# Patient Record
Sex: Male | Born: 1945 | Race: White | Hispanic: No | Marital: Married | Smoking: Never smoker
Health system: Southern US, Community
[De-identification: ages and names within clinical notes are randomized; demographics above are authoritative.]

## PROBLEM LIST (undated history)

## (undated) DIAGNOSIS — S42309A Unspecified fracture of shaft of humerus, unspecified arm, initial encounter for closed fracture: Secondary | ICD-10-CM

## (undated) DIAGNOSIS — E119 Type 2 diabetes mellitus without complications: Secondary | ICD-10-CM

## (undated) DIAGNOSIS — I10 Essential (primary) hypertension: Secondary | ICD-10-CM

## (undated) DIAGNOSIS — G4730 Sleep apnea, unspecified: Secondary | ICD-10-CM

## (undated) DIAGNOSIS — E785 Hyperlipidemia, unspecified: Secondary | ICD-10-CM

## (undated) DIAGNOSIS — C801 Malignant (primary) neoplasm, unspecified: Secondary | ICD-10-CM

## (undated) HISTORY — DX: Hyperlipidemia, unspecified: E78.5

## (undated) HISTORY — PX: VASECTOMY: SHX75

## (undated) HISTORY — DX: Essential (primary) hypertension: I10

## (undated) HISTORY — DX: Sleep apnea, unspecified: G47.30

## (undated) HISTORY — PX: TONSILLECTOMY: SUR1361

## (undated) HISTORY — DX: Type 2 diabetes mellitus without complications: E11.9

## (undated) HISTORY — DX: Malignant (primary) neoplasm, unspecified: C80.1

## (undated) HISTORY — PX: VASECTOMY REVERSAL: SHX243

## (undated) HISTORY — PX: EYE SURGERY: SHX253

## (undated) HISTORY — PX: POLYPECTOMY: SHX149

---

## 1998-03-31 HISTORY — PX: HAND SURGERY: SHX662

## 1998-07-28 ENCOUNTER — Emergency Department (HOSPITAL_COMMUNITY): Admission: EM | Admit: 1998-07-28 | Discharge: 1998-07-28 | Payer: Self-pay | Admitting: Emergency Medicine

## 2003-04-01 DIAGNOSIS — C801 Malignant (primary) neoplasm, unspecified: Secondary | ICD-10-CM

## 2003-04-01 HISTORY — PX: PROSTATECTOMY: SHX69

## 2003-04-01 HISTORY — DX: Malignant (primary) neoplasm, unspecified: C80.1

## 2003-05-17 ENCOUNTER — Inpatient Hospital Stay (HOSPITAL_COMMUNITY): Admission: RE | Admit: 2003-05-17 | Discharge: 2003-05-20 | Payer: Self-pay | Admitting: Urology

## 2003-05-17 ENCOUNTER — Encounter (INDEPENDENT_AMBULATORY_CARE_PROVIDER_SITE_OTHER): Payer: Self-pay | Admitting: *Deleted

## 2003-06-21 ENCOUNTER — Ambulatory Visit (HOSPITAL_BASED_OUTPATIENT_CLINIC_OR_DEPARTMENT_OTHER): Admission: RE | Admit: 2003-06-21 | Discharge: 2003-06-21 | Payer: Self-pay | Admitting: Urology

## 2005-03-31 HISTORY — PX: COLONOSCOPY: SHX174

## 2005-04-10 ENCOUNTER — Ambulatory Visit: Payer: Self-pay | Admitting: Gastroenterology

## 2005-05-02 ENCOUNTER — Ambulatory Visit: Payer: Self-pay | Admitting: Gastroenterology

## 2005-05-02 ENCOUNTER — Encounter (INDEPENDENT_AMBULATORY_CARE_PROVIDER_SITE_OTHER): Payer: Self-pay | Admitting: *Deleted

## 2007-12-05 ENCOUNTER — Emergency Department (HOSPITAL_BASED_OUTPATIENT_CLINIC_OR_DEPARTMENT_OTHER): Admission: EM | Admit: 2007-12-05 | Discharge: 2007-12-05 | Payer: Self-pay | Admitting: Emergency Medicine

## 2010-04-24 ENCOUNTER — Encounter (INDEPENDENT_AMBULATORY_CARE_PROVIDER_SITE_OTHER): Payer: Self-pay | Admitting: *Deleted

## 2010-05-02 ENCOUNTER — Encounter (INDEPENDENT_AMBULATORY_CARE_PROVIDER_SITE_OTHER): Payer: Self-pay | Admitting: *Deleted

## 2010-05-02 ENCOUNTER — Encounter: Payer: Self-pay | Admitting: Gastroenterology

## 2010-05-02 ENCOUNTER — Other Ambulatory Visit: Payer: BC Managed Care – PPO

## 2010-05-02 ENCOUNTER — Ambulatory Visit (INDEPENDENT_AMBULATORY_CARE_PROVIDER_SITE_OTHER): Payer: BC Managed Care – PPO | Admitting: Gastroenterology

## 2010-05-02 ENCOUNTER — Other Ambulatory Visit: Payer: Self-pay | Admitting: Gastroenterology

## 2010-05-02 DIAGNOSIS — K5730 Diverticulosis of large intestine without perforation or abscess without bleeding: Secondary | ICD-10-CM | POA: Insufficient documentation

## 2010-05-02 DIAGNOSIS — R197 Diarrhea, unspecified: Secondary | ICD-10-CM | POA: Insufficient documentation

## 2010-05-02 LAB — CBC WITH DIFFERENTIAL/PLATELET
Basophils Absolute: 0 10*3/uL (ref 0.0–0.1)
Basophils Relative: 0.3 % (ref 0.0–3.0)
Eosinophils Relative: 1.5 % (ref 0.0–5.0)
Hemoglobin: 15.1 g/dL (ref 13.0–17.0)
Lymphocytes Relative: 20.6 % (ref 12.0–46.0)
Monocytes Relative: 7.3 % (ref 3.0–12.0)
Neutro Abs: 9.4 10*3/uL — ABNORMAL HIGH (ref 1.4–7.7)
RBC: 5.01 Mil/uL (ref 4.22–5.81)

## 2010-05-02 LAB — HEPATIC FUNCTION PANEL
ALT: 26 U/L (ref 0–53)
Bilirubin, Direct: 0.1 mg/dL (ref 0.0–0.3)
Total Protein: 6.4 g/dL (ref 6.0–8.3)

## 2010-05-02 LAB — BASIC METABOLIC PANEL
BUN: 11 mg/dL (ref 6–23)
CO2: 25 mEq/L (ref 19–32)
Chloride: 106 mEq/L (ref 96–112)
Creatinine, Ser: 1.1 mg/dL (ref 0.4–1.5)

## 2010-05-02 NOTE — Letter (Signed)
Summary: Colonoscopy Date Change Letter  [facility]  [address] [place], [place] [address]   Phone: [phone]  Fax: [phone]      April 24, 2010 MRN: [id number]   Jimmy Oconnor [address] [place], [place]  [address]   Dear Mr. Leasure,   Previously you were recommended to have a repeat colonoscopy around this time. Your chart was recently reviewed by Dr. Russella Dar of [facility]. Follow up colonoscopy is now recommended in February 2017. This revised recommendation is based on current, nationally recognized guidelines for colorectal cancer screening and polyp surveillance. These guidelines are endorsed by the American Cancer Society, [employer] on Colorectal Cancer as well as numerous other major medical organizations.  Please understand that our recommendation assumes that you do not have any new symptoms such as bleeding, a change in bowel habits, anemia, or significant abdominal discomfort. If you do have any concerning GI symptoms or want to discuss the guideline recommendations, please call to arrange an office visit at your earliest convenience. Otherwise we will keep you in our reminder system and contact you 1-2 months prior to the date listed above to schedule your next colonoscopy.  Thank you,    Judie Petit T. Russella Dar, M.D.  [facility] [phone]

## 2010-05-07 ENCOUNTER — Encounter: Payer: Self-pay | Admitting: Gastroenterology

## 2010-05-08 NOTE — Assessment & Plan Note (Signed)
Summary: DIARRHEA-ALOT X1M/SCHED WITH WIFE/MAILER SENT/CX FEE/YF   Vital Signs:  Patient profile:   65 year old male Height:      67 inches Weight:      232 pounds BMI:     36.47 Pulse rate:   68 / minute Pulse rhythm:   regular BP sitting:   158 / 74  (left arm) Cuff size:   regular  Vitals Entered By: Milford Cage NCMA (May 02, 2010 11:16 AM)  History of Present Illness Visit Type: Initial Visit Primary GI MD: Elie Goody MD [facility] Primary Provider: Fuller Mandril, MD Chief Complaint: diarrhea 4-6 times per day x 1 month  History of Present Illness:   Jimmy Oconnor is a 65 year old white male, who relates a 4 week history of persistent diarrhea. He relates 4-6 times per day. He will have watery or oatmeal consistency loose urgent stools. He was treated with a long course of doxycycline 50 mg daily from October through mid January. He underwent oral surgery and mid December, and clindamycin for several days, and he previously underwent colonoscopy 2004 showing diverticulosis, hyperplastic colon polyps and hemorrhoids.   GI Review of Systems    Reports loss of appetite and  weight loss.      Denies abdominal pain, acid reflux, belching, bloating, chest pain, dysphagia with liquids, dysphagia with solids, heartburn, nausea, vomiting, vomiting blood, and  weight gain.      Reports change in bowel habits, diarrhea, hemorrhoids, and  rectal bleeding.     Denies anal fissure, black tarry stools, constipation, diverticulosis, fecal incontinence, heme positive stool, irritable bowel syndrome, jaundice, light color stool, liver problems, and  rectal pain.  Current Medications (verified): 1)  None  Allergies (verified): No Known Drug Allergies  Past History:  Past Medical History: Diverticulosis Hemorrhoids Prostate Cancer GERD Sleep Apnea Colon Polyps-Hyperplastic  Past Surgical History: Prostatectomy Vasectomy Tonsillectomy Oral Surgery 2011  Family  History: Reviewed history and no changes required. Family History of Heart Disease:   Social History: Reviewed history from 05/01/2010 and no changes required. Married Patient has never smoked.  Alcohol Use - no Illicit Drug Use - no  Review of Systems       The pertinent positives and negatives are noted as above and in the HPI. All other ROS were reviewed and were negative.   Physical Exam  General:  Well developed, well nourished, no acute distress. Head:  Normocephalic and atraumatic. Eyes:  PERRLA, no icterus. Ears:  Normal auditory acuity. Mouth:  No deformity or lesions, dentition normal. Neck:  Supple; no masses or thyromegaly. Lungs:  Clear throughout to auscultation. Heart:  Regular rate and rhythm; no murmurs, rubs,  or bruits. Abdomen:  Soft, nontender and nondistended. No masses, hepatosplenomegaly or hernias noted. Normal bowel sounds. Msk:  Symmetrical with no gross deformities. Normal posture. Pulses:  Normal pulses noted. Extremities:  No clubbing, cyanosis, edema or deformities noted. Neurologic:  Alert and  oriented x4;  grossly normal neurologically. Cervical Nodes:  No significant cervical adenopathy. Inguinal Nodes:  No significant inguinal adenopathy. Psych:  Alert and cooperative. Normal mood and affect.  Impression & Recommendations:  Problem # 1:  DIARRHEA (ICD-787.91) I suspect an infectious process, likely C. difficile. Await stool studies and treat accordingly. If they are negative I would still consider an empiric course of therapy for C. difficile. Avoid all other antibiotics for now if possible.  Problem # 2:  DIVERTICULOSIS-COLON (ICD-562.10) Long-term high fiber diet with adequate water intake.  Problem #  3:  SCREENING COLORECTAL-CANCER (ICD-V76.51) Average risk for colorectal cancer. Screening colonoscopy recommended February 2017.  Other Orders: T-C diff by PCR (62130) T-Culture, Stool (87045/[id number]) T-Stool for Fat,  Quantitative [phone]) T-Stool Fats [place] Stain [phone]) T-Stool Giardia / Crypto- EIA (01027) T-Stool for O&P (25366-44034) T-Fecal WBC (74259-56387) TLB-CBC Platelet - w/Differential (85025-CBCD) TLB-BMP (Basic Metabolic Panel-BMET) (80048-METABOL) TLB-Hepatic/Liver Function Pnl (80076-HEPATIC) TLB-TSH (Thyroid Stimulating Hormone) (56433-IRJ)  Patient Instructions: 1)  Please go directly to the basement to have your labs drawn. 2)  We will contact you once we recieve results from stool studies and lab work.  3)  Copy sent to : Fuller Mandril, MD 4)  The medication list was reviewed and reconciled.  All changed / newly prescribed medications were explained.  A complete medication list was provided to the patient / caregiver.  Orders Added: 1)  T-C diff by PCR [81755] 2)  T-Culture, Stool [87045/[id number]] 3)  T-Stool for Fat, Quantitative [82710-82660] 4)  T-Stool Fats [place] Stain [18841-66063] 5)  T-Stool Giardia / Crypto- EIA [01601] 6)  T-Stool for O&P [87177-70555] 7)  T-Fecal WBC [09323-55732] 8)  TLB-CBC Platelet - w/Differential [85025-CBCD] 9)  TLB-BMP (Basic Metabolic Panel-BMET) [80048-METABOL] 10)  TLB-Hepatic/Liver Function Pnl [80076-HEPATIC] 11)  TLB-TSH (Thyroid Stimulating Hormone) [20254-YHC]

## 2010-05-08 NOTE — Procedures (Signed)
Summary: Colonoscopy and biopsy   Colonoscopy  Procedure date:  05/02/2005  Findings:      Results: Polyp.  Results: Hemorrhoids.     Results: Diverticulosis.       Location:  [facility].    Procedures Next Due Date:    Colonoscopy: 05/2010 Patient Name: Jimmy Oconnor, Jimmy Oconnor MRN:  Procedure Procedures: Colonoscopy CPT: [address].    with polypectomy. CPT: A3573898.  Personnel: Endoscopist: Venita Lick. Russella Dar, MD, Clementeen Graham.  Referred By: Evelena Peat, MD.  Exam Location: Exam performed in Outpatient Clinic. Outpatient  Patient Consent: Procedure, Alternatives, Risks and Benefits discussed, consent obtained, from patient. Consent was obtained by the RN.  Indications  Increased Risk Screening: Personal history of prostate cancer.  History  Current Medications: Patient is not currently taking Coumadin.  Pre-Exam Physical: Performed May 02, 2005. Cardio-pulmonary exam WNL. Rectal exam abnormal. HEENT exam , Abdominal exam, Mental status exam WNL. Abnormal PE findings include: ext. hem.  Comments: Pt. history reviewed/updated, physical exam performed prior to initiation of sedation?Yes Exam Exam: Extent of exam reached: Cecum, extent intended: Cecum.  The cecum was identified by appendiceal orifice and IC valve. The Cecum was reached at 11:32 AM. ended at 11:41 AM. Time for Withdrawl: 00:09. Colon retroflexion performed. ASA Classification: II. Tolerance: excellent.  Monitoring: Pulse and BP monitoring, Oximetry used. Supplemental O2 given.  Colon Prep Used Glycolax for colon prep. Prep results: good.  Sedation Meds: Patient assessed and found to be appropriate for moderate (conscious) sedation. Fentanyl 75 mcg. given IV. Versed 9 mg. given IV.  Findings POLYP: Descending Colon, Maximum size: 4 mm. sessile polyp. Procedure:  snare without cautery, removed, retrieved, Polyp sent to pathology. ICD9: Colon Polyps: 211.3.  NORMAL EXAM: Cecum to Splenic Flexure.    - DIVERTICULOSIS: Sigmoid Colon. Not bleeding. ICD9: Diverticulosis: 562.10.  HEMORRHOIDS: Internal. Size: Small. Not bleeding. Not thrombosed. ICD9: Hemorrhoids, Internal: 455.0.   Assessment  Diagnoses: 211.3: Colon Polyps.  562.10: Diverticulosis.  455.6: Hemorrhoids, Internal and  External.   Events  Unplanned Interventions: No intervention was required.  Unplanned Events: There were no complications. Plans  Post Exam Instructions: Post sedation instructions given. No aspirin or non-steroidal containing medications: 2 weeks.  Medication Plan: Await pathology.  Patient Education: Patient given standard instructions for: Polyps. Diverticulosis. Hemorrhoids.  Disposition: After procedure patient sent to recovery. After recovery patient sent home.  Scheduling/Referral: Colonoscopy, to [facility] T. Russella Dar, MD, [facility], around May 02, 2010.    This report was created from the original endoscopy report, which was reviewed and signed by the above listed endoscopist.    cc: Evelena Peat, MD     [facility] Surgical Pathology - STATUS: Final             By: Gwenlyn Saran  ,        Perform Date: 2 Feb07 00:01  Ordered By: Rica Records Date: 4 Feb07 20:08  Facility: [facility]                               Department: CPATH  Service Report Text  [facility]   [address] [address]   [place], [place] [address]   Telephone [phone] or [phone] Fax [phone]    REPORT OF SURGICAL PATHOLOGY    Case #: [id number]   Patient Name: Jimmy Oconnor, Jimmy Oconnor   PID: [id number]  Pathologist: Berneta Levins, MD   DOB/Age 08/10/1945 (Age: 65) Gender: M   Date Taken: 05/02/2005   Date Received: 05/02/2005    FINAL DIAGNOSIS    ***MICROSCOPIC EXAMINATION AND DIAGNOSIS***    COLON, POLYP(S): HYPERPLASTIC POLYP(S). NO ADENOMATOUS CHANGE   OR MALIGNANCY IDENTIFIED. (BIOPSIES, DESCENDING)    COMMENT   There are benign colorectal type glands  that have a serrated   architecture consistent with a hyperplastic polyp(s). No   adenomatous changes or evidence of malignancy is identified.   (JAS:glk 05-05-05)    gk   Date Reported: 05/05/2005 Berneta Levins, MD   *** Electronically Signed Out By JAS ***    Clinical information   Screening (jes)    specimen(s) obtained   Colon, polyp(s), descending    Gross Description   Received in formalin is a tan, soft tissue fragment that is   submitted in toto. Size: 0.4 cm one block   (MA:jes, 05/05/05)    jes/

## 2010-08-16 NOTE — Op Note (Signed)
NAME:  Jimmy Oconnor, Jimmy Oconnor                        ACCOUNT NO.:  [id number]   MEDICAL RECORD NO.:  [id number]                   PATIENT TYPE:  AMB   LOCATION:  [facility]                                 FACILITY:  [facility]   PHYSICIAN:  Maretta Bees. Vonita Moss, M.D.             DATE OF BIRTH:  [birth year]   DATE OF PROCEDURE:  DATE OF DISCHARGE:                                 OPERATIVE REPORT   PREOPERATIVE DIAGNOSIS:  Urethral meatal stenosis.   POSTOPERATIVE DIAGNOSIS:  Urethral meatal stenosis.   PROCEDURE:  Cystoscopy and ureteromeatotomy.   SURGEON:  Maretta Bees. Vonita Moss, M.D.   ANESTHESIA:  General.   INDICATIONS:  This is a 65 year old gentleman who had a radical retropubic  prostatectomy in May 17, 2003.  At that time, he had a very tight  urethral meatus.  Interestingly, his brother had the same diagnosis.  He has  had a problem with restricturing of his urethral meatus and has required  office dilatation and in-and-out dilation, but his meatus is getting tight.  I had previously encouraged a meatotomy.  He agrees with having this done.   PROCEDURE:  The patient is brought to the operating room and placed in the  lithotomy position.  External genitalia were prepped and draped in the usual  fashion.  His ureteral meatus stenosis is sounded up to 20 [place].  Then  using a straight clamp ventrally and the meatus, for hemostasis, I performed  a ventral meatotomy with a hook-blade knife.  This allowed the urethral  opening to easily fit a 26 Jamaica sound.  The edges of the meatotomy were  over-sewn with running 4-0 chromic cat gut to provide good hemostasis and a  couple of tiny bleeders were also coagulated with a needle electrode.  At  this point, there was a very open, patent meatotomy, and I cystoscoped him.  There was no evidence of proximal stricturing, but I did not go all the way  through his fresh bladder neck anastomosis, but the rest of the urethra  looked perfectly fine.  Some  Neosporin was put on the stitches.  The patient  will be using ice packs and instructed on spreading the meatus twice daily  to maintain patency.  He tolerates the procedure well.                                               Maretta Bees. Vonita Moss, M.D.    LJP/MEDQ  D:  06/21/2003  T:  06/21/2003  Job:  [id number]

## 2010-08-16 NOTE — H&P (Signed)
NAME:  Jimmy Oconnor, Jimmy Oconnor                        ACCOUNT NO.:  [id number]   MEDICAL RECORD NO.:  [id number]                   PATIENT TYPE:  INP   LOCATION:  0353                                 FACILITY:  [facility]   PHYSICIAN:  Maretta Bees. Vonita Moss, M.D.             DATE OF BIRTH:  [birth year]   DATE OF ADMISSION:  05/17/2003  DATE OF DISCHARGE:                                HISTORY & PHYSICAL   HISTORY:  This 65 year old white male saw me in October 2004, with a history  of a brother having had prostate carcinoma diagnosed at age 5, with a PSA  of 6.  Apparently, his grandfather also had prostate cancer.  The patient  came in for evaluation.  His prostate felt benign and smooth, but fairly  enlarged, and had a PSA of 3.81 was noted which is too high for his age, and  with his family history a biopsy of the prostate was advised which was  performed and he had benign tissue on the right side, but Gleason grade 6 on  the left side.  His prostate was noted to be fairly enlarged at 61 cc,  particularly in view of his age.  He was counseled by various therapies and  he opted for radical retropubic prostatectomy knowing the risks of  impotence, incontinence, rectal injury, hemorrhage, or medical  complications.   PAST MEDICAL HISTORY:  1. Some GERD.  2. Sleep apnea.   PAST SURGICAL HISTORY:  1. Vasectomy with a reversal.  2. T&A.   MEDICATIONS:  1. Aspirin 81 mg which he stopped two weeks ago.  2. Multivitamins.   ALLERGIES:  No known drug allergies.   HABITS:  Tobacco:  None.  Alcohol:  Rare.   FAMILY HISTORY:  Positive for prostate cancer.   REVIEW OF SYSTEMS:  As noted on the health history form.   PHYSICAL EXAMINATION:  VITAL SIGNS:  Blood pressure is 126/80, pulse is 64,  temperature is 98.3.  GENERAL:  He is alert and oriented.  SKIN:  Warm and dry.  He has a beard.  NECK:  Supple.  CHEST:  Clear.  HEART:  Tones are regular.  ABDOMEN:  Soft, nontender.  GENITOURINARY:   External genitalia unremarkable.  RECTAL:  Prostate feels benign and smooth.   IMPRESSION:  1. Prostate carcinoma.  2. Gastroesophageal reflux disease.  3. Sleep apnea.                                               Maretta Bees. Vonita Moss, M.D.    LJP/MEDQ  D:  05/17/2003  T:  05/17/2003  Job:  [id number]

## 2010-08-16 NOTE — Discharge Summary (Signed)
NAME:  CRISS, PALLONE                        ACCOUNT NO.:  [id number]   MEDICAL RECORD NO.:  [id number]                   PATIENT TYPE:  INP   LOCATION:  0353                                 FACILITY:  [facility]   PHYSICIAN:  Maretta Bees. Vonita Moss, M.D.             DATE OF BIRTH:  1945/08/20   DATE OF ADMISSION:  05/17/2003  DATE OF DISCHARGE:  05/20/2003                                 DISCHARGE SUMMARY   FINAL DIAGNOSES:  1. Prostatic carcinoma.  2. Urethromeatal stenosis.  3. Gastroesophageal reflux disease.  4. Sleep apnea.   PROCEDURE:  Radical retropubic prostatectomy.   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  He was  counseled about various options and he elected radical prostatectomy.  He is  in general good health except for GERD and sleep apnea.   PHYSICAL EXAMINATION:  Noncontributory.   HOSPITAL COURSE:  After admission he underwent radical retropubic  prostatectomy, his postoperative course was unremarkable, he just had some  low-grade fever that cleared rapidly with ambulation, he never had much  Blake drainage and the drain was removed on the day of discharge, by then he  was ambulating well and tolerating his diet well.  He was sent home that day  in good condition.  The final pathology report showed Gleason Grade VI  carcinoma with margins free.  He is to go home with limited activity, he  will go home with Foley catheter in place, he will use Vicodin for pain and  resume his 81 mg aspirin in a couple of days.  Return to the office in 1  week for skin staple removal.                                               Maretta Bees. Vonita Moss, M.D.    LJP/MEDQ  D:  05/30/2003  T:  05/30/2003  Job:  [id number]

## 2010-08-16 NOTE — Op Note (Signed)
NAME:  Jimmy Oconnor, Jimmy Oconnor                        ACCOUNT NO.:  [id number]   MEDICAL RECORD NO.:  [id number]                   PATIENT TYPE:  INP   LOCATION:  0353                                 FACILITY:  [facility]   PHYSICIAN:  Maretta Bees. Vonita Moss, M.D.             DATE OF BIRTH:  07/01/1945   DATE OF PROCEDURE:  05/17/2003  DATE OF DISCHARGE:                                 OPERATIVE REPORT   PREOPERATIVE DIAGNOSIS:  Prostatic carcinoma.   POSTOPERATIVE DIAGNOSIS:  Prostatic carcinoma.   PROCEDURE:  Radical retropubic prostatectomy.   SURGEON:  Maretta Bees. Vonita Moss, M.D.   ASSISTANT:  Veverly Fells. Vernie Ammons, M.D.   ANESTHESIA:  General.   INDICATIONS:  This 65 year old gentleman had a PSA of 3.81 which was high  for his age and a Gleason grade 6 carcinoma diagnosed.  He has a family  history of prostate carcinoma.  He was advised about therapeutic options,  and he chose radical retropubic prostatectomy.  Because of the low PSA and  Gleason grade of 6, it was felt that lymph node dissection was not  indicated.   DESCRIPTION OF PROCEDURE:  The patient was brought to operating room and  placed in supine position.  The lower abdomen and external genitalia were  prepped and draped in the usual fashion.  A #24 French Foley catheter was  inserted and connected to plug after draining the bladder.  A midline  vertical and suprapubic incision was made, and the pelvic retroperitoneal  space was dissected out.  He had a fairly deep pelvis.  The endopelvic  fascia was opened bilaterally away from the neurovascular bundles.  The  puboprostatic ligaments were taken down.  Anterior surface of the prostate  was sutured with 2-0 chromic catgut to try and prevent back-bleeding.  The  neurovascular bundle was dissected out, and the McDougal clamp was placed  around it twice to put on two heavy Vicryl ties, then using the Stamey  retractor to expose the dorsal vein complex; it was divided.  There was some  back-bleeding controlled further with hemostatic sutures.  Neurovascular  bundles were separated from the urethra using the Mayo scissors.  Right-  angle clamp was placed around the urethra and divided at the apex of the  prostate.  Blunt dissection was used then to bring up the prostate off the  rectum.  The prostatic vascular pedicles were controlled with Hem-o-Lok  clips and also some suture ligatures.  He had a fairly large prostate, and  it was quite vascular, although there were no heavy bleeders, there was  certainly some continued ooze that led to blood loss, although the patient's  condition remained stable.  The seminal vesicles were dissected off the  rectum posteriorly.  The bladder neck was then opened anteriorly, and there  was a small median lobe that was extremely close to the ureteral orifices.  The mucosa was scored with the cautery and then  dissected off this median  lobe of the prostate very close to the ureteral orifices.  The prostate was  then dissected free from the bladder and the anterior surface of the seminal  vesicles dissected out.  The ampulla of each vas was divided and clipped  with a Hem-o-Lok clip.  The seminal vesicles were dissected out and Hem-o-  Lok clips put on, and some further hemostatic sutures of 2-0 chromic were  applied.  The ureteral orifices were putting out good amount of indigo  carmine stained urine but very close to the bladder neck.  I put up 4 Jamaica  ureteral catheters to make sure that when I closed the bladder neck  posteriorly, I would avoid the ureters which I did and got a very good  imbricated closure with no compromise of the ureters or ureteral orifices.  As I finished up closure of the bladder neck which had previously had the  mucosal edges sewn to the bladder neck with interrupted 4-0 chromic catgut,  I removed the ureteral catheters, and he still had a very good urinary  output.  The anastomosis between the prostate and the  bladder neck was  completed with interrupted 2-0 Vicryl at 2, 5, 7, 10, and 12 o'clock over a  22 Jamaica 5 mL Foley.  I should add that his urethral meatus required  dilation even to get a 24 catheter in at the beginning of the case.  Then 15  mL of water was placed in balloon, and a #1 Prolene was brought out through  the anterior bladder wall after it was tied to the openings in the end of  the Foley catheter.  The Prolene was later secured with a button placed at  the skin level.  Stab wounds were placed at the left side of her incision  through which a large Blake drain was placed and later sewn in place with a  black silk and connected to bulb suction.  The wound was irrigated with  antibiotic solution, and there was a watertight anastomosis with irrigation  of the Foley catheter.  Rectus muscle was reapproximated with two  interrupted 0 chromic catguts and the fascia closed with a running #1 PDS.  Skin is closed with skin staples and wound was cleaned and dressed with dry  sterile gauze dressings.  A catheter was placed on traction.  The estimated  blood loss was 1600 mL, and 2 units of autologous blood were transfused.  Sponge, needle, and instrument counts were correct was correct.  He was  taken to the recovery room in good condition having tolerated the procedure  well.                                               Maretta Bees. Vonita Moss, M.D.    LJP/MEDQ  D:  05/17/2003  T:  05/17/2003  Job:  [id number]

## 2011-01-01 LAB — CBC
MCV: 86
Platelets: 307
WBC: 9.8

## 2011-01-01 LAB — DIFFERENTIAL
Basophils Relative: 1
Eosinophils Absolute: 0.3
Lymphs Abs: 3.1
Neutrophils Relative %: 59

## 2011-01-01 LAB — URIC ACID: Uric Acid, Serum: 9.3 — ABNORMAL HIGH

## 2011-01-01 LAB — SEDIMENTATION RATE: Sed Rate: 2

## 2012-11-23 ENCOUNTER — Encounter: Payer: BC Managed Care – PPO | Attending: Family Medicine

## 2012-11-23 VITALS — Ht 68.0 in | Wt 214.9 lb

## 2012-11-23 DIAGNOSIS — E119 Type 2 diabetes mellitus without complications: Secondary | ICD-10-CM | POA: Insufficient documentation

## 2012-11-23 DIAGNOSIS — Z713 Dietary counseling and surveillance: Secondary | ICD-10-CM | POA: Insufficient documentation

## 2012-11-26 NOTE — Progress Notes (Signed)
Patient was seen on 11/23/12 for the first of a series of three diabetes self-management courses at the [facility].   Current HbA1c: 12.0%  The following learning objectives were met by the patient during this course:   Defines the role of glucose and insulin  Identifies type of diabetes and pathophysiology  Defines the diagnostic criteria for diabetes and prediabetes  States the risk factors for Type 2 Diabetes  States the symptoms of Type 2 Diabetes  Defines Type 2 Diabetes treatment goals  Defines Type 2 Diabetes treatment options  States the rationale for glucose monitoring  Identifies A1C, glucose targets, and testing times  Identifies proper sharps disposal  Defines the purpose of a diabetes food plan  Identifies carbohydrate food groups  Defines effects of carbohydrate foods on glucose levels  Identifies carbohydrate choices/grams/food labels  States benefits of physical activity and effect on glucose  Review of suggested activity guidelines  Handouts given during class include:  Type 2 Diabetes: Basics Book  My Food Plan Book  Food and Activity Log  Your patient has identified their diabetes self-care support plan as:  [facility] support group  Continued diabetes education  Follow-Up Plan: Attend core 2 and core 3

## 2012-11-26 NOTE — Patient Instructions (Signed)
Goals:  Follow Diabetes Meal Plan as instructed  Eat 3 meals and 2 snacks, every 3-5 hrs  Limit carbohydrate intake to 45-60 grams carbohydrate/meal  Limit carbohydrate intake to 0-30 grams carbohydrate/snack  Add lean protein foods to meals/snacks  Monitor glucose levels as instructed by your doctor  Aim for 15-30 mins of physical activity daily  Bring food record and glucose log to your next nutrition visit   

## 2012-12-28 ENCOUNTER — Encounter: Payer: BC Managed Care – PPO | Attending: Family Medicine

## 2012-12-28 DIAGNOSIS — Z713 Dietary counseling and surveillance: Secondary | ICD-10-CM | POA: Insufficient documentation

## 2012-12-28 DIAGNOSIS — E119 Type 2 diabetes mellitus without complications: Secondary | ICD-10-CM | POA: Insufficient documentation

## 2012-12-28 NOTE — Progress Notes (Signed)
  Patient was seen on 12/28/12 for the second of a series of three diabetes self-management courses at the [facility]. The following learning objectives were met by the patient during this course:   Explain basic nutrition maintenance and quality assurance  Describe causes, symptoms and treatment of hypoglycemia and hyperglycemia  Explain how to manage diabetes during illness  Describe the importance of good nutrition for health and healthy eating strategies  List strategies to follow meal plan when dining out  Describe the effects of alcohol on glucose and how to use it safely  Describe problem solving skills for day-to-day glucose challenges  Describe strategies to use when treatment plan needs to change  Identify important factors involved in successful weight loss  Describe ways to remain physically active  Describe the impact of regular activity on insulin resistance   Follow-Up Plan: Patient will attend the final class of the ADA Diabetes Self-Care Education.    

## 2013-01-18 ENCOUNTER — Ambulatory Visit: Payer: BC Managed Care – PPO

## 2013-02-08 ENCOUNTER — Encounter: Payer: BC Managed Care – PPO | Attending: Family Medicine

## 2013-02-08 DIAGNOSIS — Z713 Dietary counseling and surveillance: Secondary | ICD-10-CM | POA: Insufficient documentation

## 2013-02-08 DIAGNOSIS — E119 Type 2 diabetes mellitus without complications: Secondary | ICD-10-CM

## 2013-02-09 NOTE — Progress Notes (Signed)
Patient was seen on 02/08/13 for the third of a series of three diabetes self-management courses at the [facility]. The following learning objectives were met by the patient during this class:    State the amount of activity recommended for healthy living   Describe activities suitable for individual needs   Identify ways to regularly incorporate activity into daily life   Identify barriers to activity and ways to over come these barriers  Identify diabetes medications being personally used and their primary action for lowering glucose and possible side effects   Describe role of stress on blood glucose and develop strategies to address psychosocial issues   Identify diabetes complications and ways to prevent them  Explain how to manage diabetes during illness   Evaluate success in meeting personal goal   Establish 2-3 goals that they will plan to diligently work on until they return for the free 9-month follow-up visit  Your patient has established the following 4 month goals in their individualized success plan: I will count my carb choices at most meals and snacks I will increase my activity level by taking the stairs more often  Your patient has identified these potential barriers to change:  These behaviors are important to me on a scale of 8/10 and I am confident that I can make them  Your patient has identified their diabetes self-care support plan as  None stated

## 2013-06-07 ENCOUNTER — Ambulatory Visit: Payer: BC Managed Care – PPO | Admitting: *Deleted

## 2013-06-14 ENCOUNTER — Ambulatory Visit: Payer: BC Managed Care – PPO | Admitting: *Deleted

## 2015-04-06 DIAGNOSIS — Z8669 Personal history of other diseases of the nervous system and sense organs: Secondary | ICD-10-CM | POA: Diagnosis not present

## 2015-04-06 DIAGNOSIS — H02836 Dermatochalasis of left eye, unspecified eyelid: Secondary | ICD-10-CM | POA: Diagnosis not present

## 2015-04-06 DIAGNOSIS — Z9889 Other specified postprocedural states: Secondary | ICD-10-CM | POA: Diagnosis not present

## 2015-04-06 DIAGNOSIS — H02833 Dermatochalasis of right eye, unspecified eyelid: Secondary | ICD-10-CM | POA: Diagnosis not present

## 2015-04-06 DIAGNOSIS — H2513 Age-related nuclear cataract, bilateral: Secondary | ICD-10-CM | POA: Diagnosis not present

## 2015-04-06 DIAGNOSIS — H40003 Preglaucoma, unspecified, bilateral: Secondary | ICD-10-CM | POA: Diagnosis not present

## 2015-04-06 DIAGNOSIS — E119 Type 2 diabetes mellitus without complications: Secondary | ICD-10-CM | POA: Diagnosis not present

## 2015-05-02 ENCOUNTER — Encounter: Payer: Self-pay | Admitting: Gastroenterology

## 2015-06-14 DIAGNOSIS — J019 Acute sinusitis, unspecified: Secondary | ICD-10-CM | POA: Diagnosis not present

## 2015-08-09 ENCOUNTER — Encounter: Payer: Self-pay | Admitting: Gastroenterology

## 2015-08-16 DIAGNOSIS — D1801 Hemangioma of skin and subcutaneous tissue: Secondary | ICD-10-CM | POA: Diagnosis not present

## 2015-08-16 DIAGNOSIS — L821 Other seborrheic keratosis: Secondary | ICD-10-CM | POA: Diagnosis not present

## 2015-08-16 DIAGNOSIS — L918 Other hypertrophic disorders of the skin: Secondary | ICD-10-CM | POA: Diagnosis not present

## 2015-09-03 DIAGNOSIS — E785 Hyperlipidemia, unspecified: Secondary | ICD-10-CM | POA: Diagnosis not present

## 2015-09-03 DIAGNOSIS — E11630 Type 2 diabetes mellitus with periodontal disease: Secondary | ICD-10-CM | POA: Diagnosis not present

## 2015-09-05 DIAGNOSIS — E7800 Pure hypercholesterolemia, unspecified: Secondary | ICD-10-CM | POA: Diagnosis not present

## 2015-09-05 DIAGNOSIS — E119 Type 2 diabetes mellitus without complications: Secondary | ICD-10-CM | POA: Diagnosis not present

## 2015-09-28 ENCOUNTER — Ambulatory Visit (AMBULATORY_SURGERY_CENTER): Payer: Self-pay

## 2015-09-28 VITALS — Ht 68.0 in | Wt 223.0 lb

## 2015-09-28 DIAGNOSIS — Z86010 Personal history of colonic polyps: Secondary | ICD-10-CM

## 2015-09-28 MED ORDER — NA SULFATE-K SULFATE-MG SULF 17.5-3.13-1.6 GM/177ML PO SOLN: 1.0000 | Freq: Once | ORAL | Status: DC

## 2015-09-28 NOTE — Progress Notes (Signed)
No egg or soy allergy.  No previous complications from anesthesia. No home O2. No diet meds. 

## 2015-10-01 ENCOUNTER — Telehealth: Payer: Self-pay | Admitting: Gastroenterology

## 2015-10-01 NOTE — Telephone Encounter (Signed)
Called patient. He states too expensive and requesting sample vs change to cheaper prep. Instructed that I sent leslie a message to get pt a sample and told him she would call him usually within a week before the procedure 7-14 to get a sample from 3rd floor. Instructed pt if he has not heard from her by Monday 7-10 to call us back. Pt verbalized understanding. Lelan Pons PV

## 2015-10-02 ENCOUNTER — Encounter: Payer: Self-pay | Admitting: Gastroenterology

## 2015-10-09 ENCOUNTER — Telehealth: Payer: Self-pay

## 2015-10-09 NOTE — Telephone Encounter (Signed)
Spoke with patient's wife and told her I would leave his sample kit up front to be picked up.  She agreed

## 2015-10-09 NOTE — Telephone Encounter (Signed)
-----  Message from Audry Riles sent at 10/09/2015  9:20 AM EDT ----- Regarding: Prep kit sample Patient has procedure 7/14 and patient's wife called to get prep sample. Please call [phone]. Thank you

## 2015-10-12 ENCOUNTER — Encounter: Payer: Self-pay | Admitting: Gastroenterology

## 2015-10-12 ENCOUNTER — Ambulatory Visit (AMBULATORY_SURGERY_CENTER): Payer: PPO | Admitting: Gastroenterology

## 2015-10-12 VITALS — BP 110/62 | HR 57 | Temp 98.7°F | Resp 19 | Ht 68.0 in | Wt 223.0 lb

## 2015-10-12 DIAGNOSIS — D125 Benign neoplasm of sigmoid colon: Secondary | ICD-10-CM | POA: Diagnosis not present

## 2015-10-12 DIAGNOSIS — Z1211 Encounter for screening for malignant neoplasm of colon: Secondary | ICD-10-CM | POA: Diagnosis not present

## 2015-10-12 DIAGNOSIS — Z1212 Encounter for screening for malignant neoplasm of rectum: Principal | ICD-10-CM

## 2015-10-12 DIAGNOSIS — D122 Benign neoplasm of ascending colon: Secondary | ICD-10-CM

## 2015-10-12 DIAGNOSIS — D120 Benign neoplasm of cecum: Secondary | ICD-10-CM

## 2015-10-12 DIAGNOSIS — D123 Benign neoplasm of transverse colon: Secondary | ICD-10-CM

## 2015-10-12 DIAGNOSIS — E119 Type 2 diabetes mellitus without complications: Secondary | ICD-10-CM | POA: Diagnosis not present

## 2015-10-12 DIAGNOSIS — Z86010 Personal history of colonic polyps: Secondary | ICD-10-CM | POA: Diagnosis not present

## 2015-10-12 MED ORDER — SODIUM CHLORIDE 0.9 % IV SOLN: 500.0000 mL | INTRAVENOUS | Status: DC

## 2015-10-12 NOTE — Patient Instructions (Signed)

## 2015-10-12 NOTE — Op Note (Signed)
[facility] Patient Name: Jimmy Oconnor Procedure Date: 10/12/2015 9:28 AM MRN: [id number] Endoscopist: Ladene Artist , MD Age: 70 Referring MD:  Date of Birth: 1945/11/29 Gender: Male Account #: [id number] Procedure:                Colonoscopy Indications:              Screening for colorectal malignant neoplasm Medicines:                Monitored Anesthesia Care Procedure:                Pre-Anesthesia Assessment:                           - Prior to the procedure, a History and Physical                            was performed, and patient medications and                            allergies were reviewed. The patient's tolerance of                            previous anesthesia was also reviewed. The risks                            and benefits of the procedure and the sedation                            options and risks were discussed with the patient.                            All questions were answered, and informed consent                            was obtained. Prior Anticoagulants: The patient has                            taken no previous anticoagulant or antiplatelet                            agents. ASA Grade Assessment: II - A patient with                            mild systemic disease. After reviewing the risks                            and benefits, the patient was deemed in                            satisfactory condition to undergo the procedure.                           After obtaining informed consent, the colonoscope  was passed under direct vision. Throughout the                            procedure, the patient's blood pressure, pulse, and                            oxygen saturations were monitored continuously. The                            [id number]) was introduced through the anus                            and advanced to the the cecum, identified by                            appendiceal orifice  and ileocecal valve. The                            ileocecal valve, appendiceal orifice, and rectum                            were photographed. The quality of the bowel                            preparation was excellent. The colonoscopy was                            performed without difficulty. The patient tolerated                            the procedure well. Scope In: 9:39:07 AM Scope Out: 9:56:45 AM Scope Withdrawal Time: 0 hours 14 minutes 55 seconds  Total Procedure Duration: 0 hours 17 minutes 38 seconds  Findings:                 Five sessile polyps were found in the sigmoid                            colon, transverse colon, ascending colon, cecum and                            ileocecal valve. The polyps were 5 to 7 mm in size.                            These polyps were removed with a cold snare.                            Resection and retrieval were complete.                           Internal hemorrhoids were found during                            retroflexion. The hemorrhoids were small and Grade  I (internal hemorrhoids that do not prolapse).                           The exam was otherwise without abnormality on                            direct and retroflexion views.                           A few small-mouthed diverticula were found in the                            sigmoid colon. Complications:            No immediate complications. Estimated blood loss:                            None. Estimated Blood Loss:     Estimated blood loss: none. Impression:               - Five 5 to 7 mm polyps in the sigmoid colon, in                            the transverse colon, in the ascending colon, in                            the cecum and at the ileocecal valve.                           - Internal hemorrhoids.                           - Diverticulosis in the sigmoid colon. Recommendation:           - Repeat colonoscopy in 5 years for  surveillance.                           - Patient has a contact number available for                            emergencies. The signs and symptoms of potential                            delayed complications were discussed with the                            patient. Return to normal activities tomorrow.                            Written discharge instructions were provided to the                            patient.                           - High fiber diet.                           -  Continue present medications.                           - Await pathology results. Ladene Artist, MD 10/12/2015 10:01:35 AM This report has been signed electronically.

## 2015-10-12 NOTE — Progress Notes (Signed)
To recovery vss report to rn pam

## 2015-10-12 NOTE — Progress Notes (Signed)
Called to room to assist during endoscopic procedure.  Patient ID and intended procedure confirmed with present staff. Received instructions for my participation in the procedure from the performing physician.  

## 2015-10-15 ENCOUNTER — Telehealth: Payer: Self-pay

## 2015-10-15 NOTE — Telephone Encounter (Signed)
  Follow up Call-  Call back number 10/12/2015  Post procedure Call Back phone  # [phone]  Permission to leave phone message Yes     Patient questions:  Do you have a fever, pain , or abdominal swelling? No. Pain Score  0 *  Have you tolerated food without any problems? Yes.    Have you been able to return to your normal activities? Yes.    Do you have any questions about your discharge instructions: Diet   No. Medications  No. Follow up visit  No.  Do you have questions or concerns about your Care? No.  Actions: * If pain score is 4 or above: No action needed, pain <4.

## 2015-10-17 ENCOUNTER — Encounter: Payer: Self-pay | Admitting: Gastroenterology

## 2016-02-29 DIAGNOSIS — E1165 Type 2 diabetes mellitus with hyperglycemia: Secondary | ICD-10-CM | POA: Diagnosis not present

## 2016-02-29 DIAGNOSIS — E785 Hyperlipidemia, unspecified: Secondary | ICD-10-CM | POA: Diagnosis not present

## 2016-03-05 DIAGNOSIS — E785 Hyperlipidemia, unspecified: Secondary | ICD-10-CM | POA: Diagnosis not present

## 2016-03-05 DIAGNOSIS — R2242 Localized swelling, mass and lump, left lower limb: Secondary | ICD-10-CM | POA: Diagnosis not present

## 2016-03-05 DIAGNOSIS — Z23 Encounter for immunization: Secondary | ICD-10-CM | POA: Diagnosis not present

## 2016-03-05 DIAGNOSIS — E119 Type 2 diabetes mellitus without complications: Secondary | ICD-10-CM | POA: Diagnosis not present

## 2016-03-06 ENCOUNTER — Other Ambulatory Visit: Payer: Self-pay | Admitting: Family Medicine

## 2016-03-07 ENCOUNTER — Other Ambulatory Visit: Payer: Self-pay | Admitting: Family Medicine

## 2016-03-07 DIAGNOSIS — R2242 Localized swelling, mass and lump, left lower limb: Secondary | ICD-10-CM

## 2016-03-19 ENCOUNTER — Ambulatory Visit
Admission: RE | Admit: 2016-03-19 | Discharge: 2016-03-19 | Disposition: A | Discharge status: Home / Self Care | Payer: PPO | Source: Ambulatory Visit | Attending: Family Medicine | Admitting: Family Medicine

## 2016-03-19 DIAGNOSIS — R2242 Localized swelling, mass and lump, left lower limb: Secondary | ICD-10-CM

## 2016-03-19 DIAGNOSIS — M7989 Other specified soft tissue disorders: Secondary | ICD-10-CM | POA: Diagnosis not present

## 2016-03-19 MED ORDER — GADOBENATE DIMEGLUMINE 529 MG/ML IV SOLN
20.0000 mL | Freq: Once | INTRAVENOUS | Status: AC | PRN
  Administered 2016-03-19: 20 mL via INTRAVENOUS

## 2016-04-07 DIAGNOSIS — Z7984 Long term (current) use of oral hypoglycemic drugs: Secondary | ICD-10-CM | POA: Diagnosis not present

## 2016-04-07 DIAGNOSIS — Z9889 Other specified postprocedural states: Secondary | ICD-10-CM | POA: Diagnosis not present

## 2016-04-07 DIAGNOSIS — H5203 Hypermetropia, bilateral: Secondary | ICD-10-CM | POA: Diagnosis not present

## 2016-04-07 DIAGNOSIS — H04123 Dry eye syndrome of bilateral lacrimal glands: Secondary | ICD-10-CM | POA: Diagnosis not present

## 2016-04-07 DIAGNOSIS — H524 Presbyopia: Secondary | ICD-10-CM | POA: Diagnosis not present

## 2016-04-07 DIAGNOSIS — E119 Type 2 diabetes mellitus without complications: Secondary | ICD-10-CM | POA: Diagnosis not present

## 2016-04-07 DIAGNOSIS — H2513 Age-related nuclear cataract, bilateral: Secondary | ICD-10-CM | POA: Diagnosis not present

## 2016-04-07 DIAGNOSIS — H52209 Unspecified astigmatism, unspecified eye: Secondary | ICD-10-CM | POA: Diagnosis not present

## 2016-04-07 DIAGNOSIS — H40003 Preglaucoma, unspecified, bilateral: Secondary | ICD-10-CM | POA: Diagnosis not present

## 2016-04-15 ENCOUNTER — Ambulatory Visit: Payer: Self-pay | Admitting: Surgery

## 2016-04-15 ENCOUNTER — Other Ambulatory Visit (HOSPITAL_COMMUNITY): Payer: Self-pay | Admitting: Surgery

## 2016-04-15 DIAGNOSIS — R2242 Localized swelling, mass and lump, left lower limb: Secondary | ICD-10-CM

## 2016-04-15 NOTE — H&P (Signed)
Jimmy Oconnor 04/15/2016 11:26 AM Location: [facility] Patient #: [id number] DOB: 1945/09/09 Married / Language: English / Race: White Male  History of Present Illness Jimmy Jimmy Oconnor A. Jimmy Barlett MD; 04/15/2016 12:09 PM) Patient words: Patient seen at the request of Jimmy Matter PA for left medial thigh mass. This has been present for 18 months and is slowly been increasing in size. He denies any numbness of his foot, weakness of his lower leg or numbness around the mass in his left medial thigh. He is able to walk without difficulty and feels the strength in his leg is normal. He does have some left hip pain and knee pain he states. He has lost weight and noticed the mass 18 months ago.                CLINICAL DATA: Left hip pain with swollen inner upper thigh, palpable mass was marked with capsules, pt says the mass appeared about 1 year ago but continues to increase in size. Pt has hx prostate cancer.  EXAM: MR OF THE LEFT LOWER EXTREMITY WITHOUT AND WITH CONTRAST  TECHNIQUE: Multiplanar, multisequence MR imaging of the left thigh was performed both before and after administration of intravenous contrast.  CONTRAST: 45mL MULTIHANCE GADOBENATE DIMEGLUMINE 529 MG/ML IV SOLN  COMPARISON: None.  FINDINGS: Bones/Joint/Cartilage  No marrow signal abnormality. No fracture or dislocation. Normal alignment. No joint effusion.  Ligaments, Muscles and Tendons 7.6 x 10 x 16 cm T1 hyperintense mass in the left adductor magnus muscle with complete signal dropout on fat saturated images. Few thin internal septations. Thin spiculated areas of enhancement in the superior most aspect of the mass.  Muscles are otherwise normal.  Soft tissue No fluid collection or hematoma. No other soft tissue mass.  IMPRESSION: 7.6 x 10 x 16 cm soft tissue mass located in the left adductor magnus muscle with thin enhancing spiculation in the superior most aspect of the  mass which may reflect a simple lipoma with small areas of fat necrosis versus atypical lipoma.   Electronically Signed By: Jimmy Oconnor On: 03/19/2016 14:36.  The patient is a 71 year old male.   Past Surgical History Jimmy Oconnor, [place]; 04/15/2016 11:27 AM) Colon Polyp Removal - Colonoscopy Oral Surgery Prostate Surgery - Removal Tonsillectomy Vasectomy  Diagnostic Studies History Jimmy Oconnor, RMA; 04/15/2016 11:27 AM) Colonoscopy within last year  Allergies Jimmy Oconnor, RMA; 04/15/2016 11:32 AM) No Known Drug Allergies 04/15/2016  Medication History Jimmy Oconnor, RMA; 04/15/2016 11:32 AM) Atorvastatin Calcium (20MG  Tablet, Oral daily) Active. Januvia (100MG  Tablet, Oral daily) Active. MetFORMIN HCl ER (500MG  Tablet ER 24HR, Oral daily) Active. Lisinopril (5MG  Tablet, Oral) Active. ZyrTEC (10MG  Tablet, Oral daily) Active. Medications Reconciled  Social History Jimmy Oconnor, [place]; 04/15/2016 11:27 AM) Alcohol use Occasional alcohol use. Caffeine use Carbonated beverages, Tea. No drug use Tobacco use Never smoker.  Family History Jimmy Oconnor, [place]; 04/15/2016 11:27 AM) Arthritis Mother. Diabetes Mellitus Mother.  Other Problems Jimmy Oconnor, RMA; 04/15/2016 11:27 AM) Diabetes Mellitus Diverticulosis Enlarged Prostate Gastroesophageal Reflux Disease Hemorrhoids Prostate Cancer     Review of Systems Jimmy Oconnor RMA; 04/15/2016 11:27 AM) General Not Present- Appetite Loss, Chills, Fatigue, Fever, Night Sweats, Weight Gain and Weight Loss. Skin Not Present- Change in Wart/Mole, Dryness, Hives, Jaundice, New Lesions, Non-Healing Wounds, Rash and Ulcer. HEENT Present- Seasonal Allergies and Wears glasses/contact lenses. Not Present- Earache, Hearing Loss, Hoarseness, Nose Bleed, Oral Ulcers, Ringing in the Ears, Sinus Pain, Sore Throat, Visual Disturbances and Yellow Eyes. Respiratory  Present- Snoring. Not  Present- Bloody sputum, Chronic Cough, Difficulty Breathing and Wheezing. Breast Not Present- Breast Mass, Breast Pain, Nipple Discharge and Skin Changes. Cardiovascular Not Present- Chest Pain, Difficulty Breathing Lying Down, Leg Cramps, Palpitations, Rapid Heart Rate, Shortness of Breath and Swelling of Extremities. Gastrointestinal Present- Hemorrhoids. Not Present- Abdominal Pain, Bloating, Bloody Stool, Change in Bowel Habits, Chronic diarrhea, Constipation, Difficulty Swallowing, Excessive gas, Gets full quickly at meals, Indigestion, Nausea, Rectal Pain and Vomiting. Male Genitourinary Present- Impotence. Not Present- Blood in Urine, Change in Urinary Stream, Frequency, Nocturia, Painful Urination, Urgency and Urine Leakage. Musculoskeletal Present- Joint Pain, Joint Stiffness and Swelling of Extremities. Not Present- Back Pain, Muscle Pain and Muscle Weakness. Neurological Not Present- Decreased Memory, Fainting, Headaches, Numbness, Seizures, Tingling, Tremor, Trouble walking and Weakness. Psychiatric Not Present- Anxiety, Bipolar, Change in Sleep Pattern, Depression, Fearful and Frequent crying. Endocrine Present- New Diabetes. Not Present- Cold Intolerance, Excessive Hunger, Hair Changes, Heat Intolerance and Hot flashes. Hematology Not Present- Blood Thinners, Easy Bruising, Excessive bleeding, Gland problems, HIV and Persistent Infections.  Vitals Jimmy Oconnor RMA; 04/15/2016 11:33 AM) 04/15/2016 11:32 AM Weight: 228.6 lb Height: 68in Body Surface Area: 2.16 m Body Mass Index: 34.76 kg/m  Temp.: 97.59F  Pulse: 63 (Regular)  BP: 150/82 (Sitting, Left Arm, Standard)      Physical Exam (Jimmy Jimmy Oconnor A. Jimmy Oconnor Propst MD; 04/15/2016 12:09 PM)  General Mental Status-Alert. General Appearance-Consistent with stated age. Hydration-Well hydrated. Voice-Normal.  Head and Neck Head-normocephalic, atraumatic with no lesions or palpable  masses. Trachea-midline. Thyroid Gland Characteristics - normal size and consistency.  Chest and Lung Exam Chest and lung exam reveals -quiet, even and easy respiratory effort with no use of accessory muscles and on auscultation, normal breath sounds, no adventitious sounds and normal vocal resonance. Inspection Chest Wall - Normal. Back - normal.  Cardiovascular Cardiovascular examination reveals -normal heart sounds, regular rate and rhythm with no murmurs and normal pedal pulses bilaterally.  Peripheral Vascular Note: Good capillary refill. 2+ normal dorsalis pedis and posterior tibial pulses bilaterally  Neurologic Note: Left lower extremity shows good strength in his abductor, abductor, flexor extensor muscles of the lower extremity on the left. He has good function of the flexors and extensors at the ankle. Right lower extremity is normal.  Musculoskeletal Note: Left medial thigh mass. It is not mobile. There is no skin pigmentation. There is no evidence of muscle atrophy or weakness.  Lymphatic Femoral & Inguinal  Generalized Femoral & Inguinal Lymphatics: Bilateral - Description - Normal. Tenderness - Non Tender.    Assessment & Plan (Leydy Worthey A. 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 I reviewed the MRI images. Given its large size and location, recommend core biopsy preoperatively to do decide on proper surgical technique. 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 He has no neurological symptoms or any signs of vascular disease involving that leg on physical examination today. I discussed the rationale for doing the biopsy this circumstance given the benign appearance on MRI. They would like to proceed with a biopsy prior to any major surgery  anyway. Once the biopsy is done, we'll have more information and compliant surgical approach. I discussed possible risk of bleeding, infection, nerve injury leading to dysfunction, numbness, I believe further operations and/or procedures, the knee further possible treatments, blood clots, stroke, DVT,  wound complications, use of a drain, and anesthesia risk.  Current Plans Pt Education - [facility] Free Text Education/Instructions: discussed with patient and provided

## 2016-04-21 ENCOUNTER — Other Ambulatory Visit: Payer: Self-pay | Admitting: Radiology

## 2016-04-22 ENCOUNTER — Encounter (HOSPITAL_COMMUNITY): Payer: Self-pay

## 2016-04-22 ENCOUNTER — Ambulatory Visit (HOSPITAL_COMMUNITY)
Admission: RE | Admit: 2016-04-22 | Discharge: 2016-04-22 | Disposition: A | Discharge status: Home / Self Care | Payer: PPO | Source: Ambulatory Visit | Attending: Surgery | Admitting: Surgery

## 2016-04-22 DIAGNOSIS — D179 Benign lipomatous neoplasm, unspecified: Secondary | ICD-10-CM | POA: Diagnosis not present

## 2016-04-22 DIAGNOSIS — R2242 Localized swelling, mass and lump, left lower limb: Secondary | ICD-10-CM | POA: Insufficient documentation

## 2016-04-22 LAB — APTT: APTT: 32 s (ref 24–36)

## 2016-04-22 LAB — CBC
HCT: 46.1 % (ref 39.0–52.0)
Hemoglobin: 15.6 g/dL (ref 13.0–17.0)
MCH: 29.1 pg (ref 26.0–34.0)
MCHC: 33.8 g/dL (ref 30.0–36.0)
MCV: 86 fL (ref 78.0–100.0)
PLATELETS: 300 10*3/uL (ref 150–400)
RBC: 5.36 MIL/uL (ref 4.22–5.81)
RDW: 12.7 % (ref 11.5–15.5)
WBC: 11 10*3/uL — AB (ref 4.0–10.5)

## 2016-04-22 LAB — PROTIME-INR
INR: 1.05
PROTHROMBIN TIME: 13.8 s (ref 11.4–15.2)

## 2016-04-22 LAB — GLUCOSE, CAPILLARY: GLUCOSE-CAPILLARY: 159 mg/dL — AB (ref 65–99)

## 2016-04-22 MED ORDER — FENTANYL CITRATE (PF) 100 MCG/2ML IJ SOLN
INTRAMUSCULAR | Status: AC | PRN
  Administered 2016-04-22 (×2): 25 ug via INTRAVENOUS

## 2016-04-22 MED ORDER — LIDOCAINE HCL 1 % IJ SOLN
INTRAMUSCULAR | Status: AC
  Filled 2016-04-22: qty 20

## 2016-04-22 MED ORDER — MIDAZOLAM HCL 2 MG/2ML IJ SOLN
INTRAMUSCULAR | Status: AC | PRN
  Administered 2016-04-22: 0.5 mg via INTRAVENOUS
  Administered 2016-04-22: 2 mg via INTRAVENOUS

## 2016-04-22 MED ORDER — FENTANYL CITRATE (PF) 100 MCG/2ML IJ SOLN
INTRAMUSCULAR | Status: AC
  Filled 2016-04-22: qty 2

## 2016-04-22 MED ORDER — SODIUM CHLORIDE 0.9 % IV SOLN: INTRAVENOUS | Status: DC

## 2016-04-22 MED ORDER — MIDAZOLAM HCL 2 MG/2ML IJ SOLN
INTRAMUSCULAR | Status: AC
  Filled 2016-04-22: qty 2

## 2016-04-22 NOTE — Procedures (Signed)
S/P [place] BX LT THIGH MASS  No comp Stable Path pending EBL 0 FULL REPORT IN PACS

## 2016-04-22 NOTE — Sedation Documentation (Signed)
Patient denies pain and is resting comfortably.  

## 2016-04-22 NOTE — Sedation Documentation (Signed)
Attempted to call short stay. On hold, will call back.

## 2016-04-22 NOTE — Discharge Instructions (Signed)
Needle Biopsy, Care After °Introduction °These instructions give you information about caring for yourself after your procedure. Your doctor may also give you more specific instructions. Call your doctor if you have any problems or questions after your procedure. °Follow these instructions at home: °· Rest as told by your doctor. °· Take medicines only as told by your doctor. °· There are many different ways to close and cover the biopsy site, including stitches (sutures), skin glue, and adhesive strips. Follow instructions from your doctor about: °¨ How to take care of your biopsy site. °¨ When and how you should change your bandage (dressing). °¨ When you should remove your dressing. °¨ Removing whatever was used to close your biopsy site. °· Check your biopsy site every day for signs of infection. Watch for: °¨ Redness, swelling, or pain. °¨ Fluid, blood, or pus. °Contact a doctor if: °· You have a fever. °· You have redness, swelling, or pain at the biopsy site, and it lasts longer than a few days. °· You have fluid, blood, or pus coming from the biopsy site. °· You feel sick to your stomach (nauseous). °· You throw up (vomit). °Get help right away if: °· You are short of breath. °· You have trouble breathing. °· Your chest hurts. °· You feel dizzy or you pass out (faint). °· You have bleeding that does not stop with pressure or a bandage. °· You cough up blood. °· Your belly (abdomen) hurts. °This information is not intended to replace advice given to you by your health care provider. Make sure you discuss any questions you have with your health care provider. °Document Released: 02/28/2008 Document Revised: 08/23/2015 Document Reviewed: 03/13/2014 °© 2017 [employer] ° °

## 2016-04-22 NOTE — Sedation Documentation (Signed)
Attempted to call report to short stay Rn. Will call back

## 2016-04-22 NOTE — H&P (Signed)
Chief Complaint: Patient was seen in consultation today for left thigh mass biopsy at the request of Cornett,Thomas  Referring Physician(s): Cornett,Thomas  Supervising Physician: Daryll Brod  Patient Status: [facility] - Out-pt  History of Present Illness: Jimmy Oconnor is a 71 y.o. male   Noticed left thigh mass some 18 months ago Gradual increase in size Denies pain + wt loss Hx Prostate Ca  MR 03/19/16: IMPRESSION: 7.6 x 10 x 16 cm soft tissue mass located in the left adductor magnus muscle with thin enhancing spiculation in the superior most aspect of the mass which may reflect a simple lipoma with small areas of fat necrosis versus atypical lipoma.  Request for biopsy per Dr Brantley Stage Must exclude low grade liposarcoma---which would dictate managemant   Past Medical History:  Diagnosis Date  . Cancer [facility]) 2005   prostate  . Diabetes mellitus without complication ([place])   . Hyperlipidemia   . Hypertension   . Sleep apnea    mild case per pt    Past Surgical History:  Procedure Laterality Date  . COLONOSCOPY  2007  . HAND SURGERY  2000  . POLYPECTOMY    . PROSTATECTOMY  2005  . TONSILLECTOMY      Allergies: Patient has no known allergies.  Medications: Prior to Admission medications   Medication Sig Start Date End Date Taking? Authorizing Provider  aspirin 81 MG tablet Take 81 mg by mouth every evening.    Yes Historical Provider, MD  atorvastatin (LIPITOR) 10 MG tablet Take 10 mg by mouth every evening.    Yes Historical Provider, MD  lisinopril (PRINIVIL,ZESTRIL) 5 MG tablet Take 5 mg by mouth every evening.    Yes Historical Provider, MD  metFORMIN (GLUCOPHAGE) 500 MG tablet Take 1,000 mg by mouth every evening.    Yes Historical Provider, MD  sitaGLIPtin (JANUVIA) 100 MG tablet Take 100 mg by mouth every evening.  09/05/15  Yes Historical Provider, MD  cetirizine (ZYRTEC) 10 MG tablet Take 10 mg by mouth daily as needed for allergies.      Historical Provider, MD     Family History  Problem Relation Age of Onset  . Colon cancer Neg Hx     Social History   Social History  . Marital status: Married    Spouse name: N/A  . Number of children: N/A  . Years of education: N/A   Social History Main Topics  . Smoking status: Never Smoker  . Smokeless tobacco: Never Used  . Alcohol use No  . Drug use: No  . Sexual activity: Not Asked   Other Topics Concern  . None   Social History Narrative  . None    Review of Systems: A 12 point ROS discussed and pertinent positives are indicated in the HPI above.  All other systems are negative.  Review of Systems  Constitutional: Positive for unexpected weight change. Negative for activity change, appetite change, fatigue and fever.  Gastrointestinal: Negative for abdominal pain.  Musculoskeletal: Negative for gait problem.  Neurological: Negative for weakness.  Psychiatric/Behavioral: Negative for behavioral problems and confusion.    Vital Signs: BP (!) 159/78   Pulse (!) 59   Temp 98.5 F (36.9 C)   Resp 20   Ht 5\' 8"  (1.727 m)   Wt 220 lb (99.8 kg)   SpO2 97%   BMI 33.45 kg/m   Physical Exam  Constitutional: He is oriented to person, place, and time.  Cardiovascular: Normal rate, regular rhythm and  normal heart sounds.   Pulmonary/Chest: Effort normal and breath sounds normal.  Abdominal: Soft. Bowel sounds are normal.  Musculoskeletal: Normal range of motion.  Neurological: He is alert and oriented to person, place, and time.  Skin: Skin is warm and dry.  Psychiatric: He has a normal mood and affect. His behavior is normal. Judgment and thought content normal.  Nursing note and vitals reviewed.   Mallampati Score:  MD Evaluation Airway: WNL Heart: WNL Abdomen: WNL Chest/ Lungs: WNL ASA  Classification: 2 Mallampati/Airway Score: Two  Imaging: No results found.  Labs:  CBC: No results for input(s): WBC, HGB, HCT, PLT in the last 8760  hours.  COAGS: No results for input(s): INR, APTT in the last 8760 hours.  BMP: No results for input(s): NA, K, CL, CO2, GLUCOSE, BUN, CALCIUM, CREATININE, GFRNONAA, GFRAA in the last 8760 hours.  Invalid input(s): CMP  LIVER FUNCTION TESTS: No results for input(s): BILITOT, AST, ALT, ALKPHOS, PROT, ALBUMIN in the last 8760 hours.  TUMOR MARKERS: No results for input(s): AFPTM, CEA, CA199, CHROMGRNA in the last 8760 hours.  Assessment and Plan:  Left thigh mass  enlarging Hx prostate Ca Need biopsy to dicate management per Dr Brantley Stage Risks and Benefits discussed with the patient including, but not limited to bleeding, infection, damage to adjacent structures or low yield requiring additional tests. All of the patient's questions were answered, patient is agreeable to proceed. Consent signed and in chart.   Thank you for this interesting consult.  I greatly enjoyed meeting Jimmy Oconnor and look forward to participating in their care.  A copy of this report was sent to the requesting provider on this date.  Electronically Signed: Monia Sabal A 04/22/2016, 12:59 PM   I spent a total of  30 Minutes   in face to face in clinical consultation, greater than 50% of which was counseling/coordinating care for left thigh mass bx

## 2016-05-12 ENCOUNTER — Ambulatory Visit: Payer: Self-pay | Admitting: Surgery

## 2016-05-12 DIAGNOSIS — R2242 Localized swelling, mass and lump, left lower limb: Secondary | ICD-10-CM | POA: Diagnosis not present

## 2016-05-12 NOTE — H&P (Signed)
Jimmy Oconnor 05/12/2016 2:45 PM Location: [facility] Patient #: [id number] DOB: [birth year] Married / Language: English / Race: White Male  History of Present Illness Jimmy Moores A. Kayven Aldaco Oconnor; 05/12/2016 3:18 PM) Patient words: Patient returns after core biopsy of large left medial thigh mass. 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 He returns today to discuss options. His symptoms have not changed. Denies any new or neurological problems.            CLINICAL DATA: Left hip pain with swollen inner upper thigh, palpable mass was marked with capsules, pt says the mass appeared about 1 year ago but continues to increase in size. Pt has hx prostate cancer. EXAM: MR OF THE LEFT LOWER EXTREMITY WITHOUT AND WITH CONTRAST TECHNIQUE: Multiplanar, multisequence MR imaging of the left thigh was performed both before and after administration of intravenous contrast. CONTRAST: 37mL MULTIHANCE GADOBENATE DIMEGLUMINE 529 MG/ML IV SOLN COMPARISON: None. FINDINGS: Bones/Joint/Cartilage No marrow signal abnormality. No fracture or dislocation. Normal alignment. No joint effusion. Ligaments, Muscles and Tendons 7.6 x 10 x 16 cm T1 hyperintense mass in the left adductor magnus muscle with complete signal dropout on fat saturated images. Few thin internal septations. Thin spiculated areas of enhancement in the superior most aspect of the mass. Muscles are otherwise normal. Soft tissue No fluid collection or hematoma. No other soft tissue mass. IMPRESSION: 7.6 x 10 x 16 cm soft tissue mass located in the left adductor magnus muscle with thin enhancing spiculation in the superior most aspect of the mass which may reflect a simple lipoma with small areas of fat necrosis versus atypical lipoma. Electronically Signed By: Jimmy Oconnor On: 03/19/2016 14:36  Orders Requiring a Screening Form  Procedure Order Status Form Status MR  FEMUR LEFT W WO CONTRAST <[email or web address]> Completed Completed     Patient: Jimmy Oconnor, Jimmy Oconnor Collected: 04/22/2016 Client: [facility] Accession: [id number] Received: 04/22/2016 Jimmy Oconnor DOB: 1945-12-02 Age: 36 Gender: M Reported: 04/24/2016 [address] Patient Ph: [phone] MRN #: [id number] [place], [place] [address] Visit #: [id number] Chart #: Phone: [phone] Fax: CC: Jimmy Oconnor REPORT OF SURGICAL PATHOLOGY FINAL DIAGNOSIS Diagnosis Soft Tissue Needle Core Biopsy, Left Thigh Mass - ADIPOSE TISSUE CONSISTENT WITH LIPOMATOUS TUMOR. Microscopic Comment The core biopsies show adipose tissue without atypia or increased cellularity. The findings in these biopsies are consistent with lipoma. Dr. Gari Oconnor agrees. (Jimmy Oconnor:gt, 04/24/16) Jimmy Laws Oconnor Pathologist, Electronic Signature (Case signed 04/24/2016) Specimen Gross and Clinical Information Specimen(s) Obtained: Soft Tissue Needle Core Biopsy, Left Thigh Mass Specimen Clinical Information Lipoma vs Low Grade Liposarcoma (nt) Gross.  The patient is a 71 year old male.   Allergies Jimmy Oconnor, [place]; 05/12/2016 2:45 PM) No Known Drug Allergies 04/15/2016  Medication History Jimmy Oconnor, Jimmy Oconnor; 05/12/2016 2:46 PM) Atorvastatin Calcium (20MG  Tablet, Oral daily) Active. Januvia (100MG  Tablet, Oral daily) Active. MetFORMIN HCl ER (500MG  Tablet ER 24HR, Oral daily) Active. Lisinopril (5MG  Tablet, Oral) Active. Aspirin (81MG  Tablet, Oral) Active. Medications Reconciled    Vitals Jimmy Oconnor Jimmy Oconnor; 05/12/2016 2:46 PM) 05/12/2016 2:46 PM Weight: 227.4 lb Height: 68in Body Surface Area: 2.16 m Body Mass Index: 34.58 kg/m  Temp.: 98.65F  Pulse: 63 (Regular)  BP: 150/82 (Sitting, Left Arm, Standard)      Physical Exam (Jimmy Oconnor; 05/12/2016 3:19 PM)  Head and Neck Head-normocephalic, atraumatic with no lesions or palpable  masses.  Cardiovascular Cardiovascular examination reveals -on palpation PMI is  normal in location and amplitude, no palpable S3 or S4. Normal cardiac borders., normal heart sounds, regular rate and rhythm with no murmurs, carotid auscultation reveals no bruits and normal pedal pulses bilaterally.  Neurologic Neurologic evaluation reveals -alert and oriented x 3 with no impairment of recent or remote memory. Mental Status-Normal.  Musculoskeletal Note: Large left medial thigh mass. Partially submuscular.    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 I reviewed the MRI images. Given its large size and location, recommend core biopsy preoperatively to do decide on proper surgical technique. 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 He has no neurological symptoms or any signs of vascular disease involving that leg on physical examination today. I discussed the rationale for doing the biopsy this circumstance given the benign appearance on MRI. They would like to proceed with a biopsy prior to any major surgery anyway. Once the biopsy is done, we'll have more information and compliant surgical approach. I discussed possible risk of bleeding, infection, nerve injury leading to dysfunction, numbness, I believe further operations and/or procedures, the knee further possible treatments, blood clots, stroke, DVT, wound complications, use of a drain, and anesthesia risk.  Current Plans Pt Education - [facility] Education - Written Instructions given: discussed with patient and provided information. The pathophysiology of skin & subcutaneous masses was discussed. Natural history risks without surgery were discussed. I recommended surgery to remove the  mass. I explained the technique of removal with use of local anesthesia & possible need for more aggressive sedation/anesthesia for patient comfort.  Risks such as bleeding, infection, wound breakdown, heart attack, death, and other risks were discussed. I noted a good likelihood this will help address the problem. Possibility that this will not correct all symptoms was explained. Possibility of regrowth/recurrence of the mass was discussed. We will work to minimize complications. Questions were answered. The patient expresses understanding & wishes to proceed with surgery.

## 2016-05-26 NOTE — Pre-Procedure Instructions (Signed)
Jimmy Oconnor  05/26/2016      [id number] - [place], [place] - [address] [place] [address] Phone: [phone] Fax: [phone]    Your procedure is scheduled on Thurs. March 1 @ 1230 PM.  Report to [place] at 1030 A.M.  Call this number if you have problems the morning of surgery:  [phone]   Remember:  Do not eat food or drink liquids after midnight.  Take these medicines the morning of surgery with A SIP OF WATER cetirizine (zyrtec).  Stop taking any Vitamins or Herbal medications. No Goody's, Bc's, Aleve, Advil, Motrin, Ibuprofen, or Fish Oil.   Do not wear jewelry, make-up or nail polish.  Do not wear lotions, powders, or perfumes, or deoderant.  Men may shave face and neck.  Do not bring valuables to the hospital.   [facility] is not responsible for any belongings or valuables.  Contacts, dentures or bridgework may not be worn into surgery.  Leave your suitcase in the car.  After surgery it may be brought to your room.  For patients admitted to the hospital, discharge time will be determined by your treatment team.  Patients discharged the day of surgery will not be allowed to drive home.   Special instructions:  *   How to Manage Your Diabetes Before and After Surgery  Why is it important to control my blood sugar before and after surgery? . Improving blood sugar levels before and after surgery helps healing and can limit problems. . A way of improving blood sugar control is eating a healthy diet by: o  Eating less sugar and carbohydrates o  Increasing activity/exercise o  Talking with your doctor about reaching your blood sugar goals . High blood sugars (greater than 180 mg/dL) can raise your risk of infections and slow your recovery, so you will need to focus on controlling your diabetes during the weeks before surgery. . Make sure that the doctor who takes care of your diabetes  knows about your planned surgery including the date and location.  How do I manage my blood sugar before surgery? . Check your blood sugar at least 4 times a day, starting 2 days before surgery, to make sure that the level is not too high or low. o Check your blood sugar the morning of your surgery when you wake up and every 2 hours until you get to the Short Stay unit. . If your blood sugar is less than 70 mg/dL, you will need to treat for low blood sugar: o Do not take insulin. o Treat a low blood sugar (less than 70 mg/dL) with  cup of clear juice (cranberry or apple), 4 glucose tablets, OR glucose gel. o Recheck blood sugar in 15 minutes after treatment (to make sure it is greater than 70 mg/dL). If your blood sugar is not greater than 70 mg/dL on recheck, call [phone] for further instructions. . Report your blood sugar to the short stay nurse when you get to Short Stay.  . If you are admitted to the hospital after surgery: o Your blood sugar will be checked by the staff and you will probably be given insulin after surgery (instead of oral diabetes medicines) to make sure you have good blood sugar levels. o The goal for blood sugar control after surgery is 80-180 mg/dL.   Please read over the following fact sheets that you were given. Pain Booklet, Coughing and Deep  Breathing and Surgical Site Infection Prevention

## 2016-05-27 ENCOUNTER — Encounter (HOSPITAL_COMMUNITY)
Admission: RE | Admit: 2016-05-27 | Discharge: 2016-05-27 | Disposition: A | Discharge status: Home / Self Care | Payer: PPO | Source: Ambulatory Visit | Attending: Surgery | Admitting: Surgery

## 2016-05-27 ENCOUNTER — Encounter (HOSPITAL_COMMUNITY): Payer: Self-pay

## 2016-05-27 DIAGNOSIS — I10 Essential (primary) hypertension: Secondary | ICD-10-CM | POA: Diagnosis not present

## 2016-05-27 DIAGNOSIS — Z79899 Other long term (current) drug therapy: Secondary | ICD-10-CM | POA: Diagnosis not present

## 2016-05-27 DIAGNOSIS — E119 Type 2 diabetes mellitus without complications: Secondary | ICD-10-CM | POA: Diagnosis not present

## 2016-05-27 DIAGNOSIS — Z6834 Body mass index (BMI) 34.0-34.9, adult: Secondary | ICD-10-CM | POA: Diagnosis not present

## 2016-05-27 DIAGNOSIS — Z7984 Long term (current) use of oral hypoglycemic drugs: Secondary | ICD-10-CM | POA: Diagnosis not present

## 2016-05-27 DIAGNOSIS — E669 Obesity, unspecified: Secondary | ICD-10-CM | POA: Diagnosis not present

## 2016-05-27 DIAGNOSIS — D179 Benign lipomatous neoplasm, unspecified: Secondary | ICD-10-CM | POA: Diagnosis not present

## 2016-05-27 DIAGNOSIS — D1724 Benign lipomatous neoplasm of skin and subcutaneous tissue of left leg: Secondary | ICD-10-CM | POA: Diagnosis not present

## 2016-05-27 HISTORY — DX: Unspecified fracture of shaft of humerus, unspecified arm, initial encounter for closed fracture: S42.309A

## 2016-05-27 LAB — BASIC METABOLIC PANEL
Anion gap: 7 (ref 5–15)
BUN: 13 mg/dL (ref 6–20)
CHLORIDE: 107 mmol/L (ref 101–111)
CO2: 24 mmol/L (ref 22–32)
Calcium: 9.3 mg/dL (ref 8.9–10.3)
Creatinine, Ser: 1.23 mg/dL (ref 0.61–1.24)
GFR calc Af Amer: >60 mL/min (ref >60)
GFR calc non Af Amer: 59 mL/min — ABNORMAL LOW (ref >60)
GLUCOSE: 329 mg/dL — AB (ref 65–99)
POTASSIUM: 5.1 mmol/L (ref 3.5–5.1)
SODIUM: 138 mmol/L (ref 135–145)

## 2016-05-27 LAB — CBC
HCT: 46 % (ref 39.0–52.0)
Hemoglobin: 15.2 g/dL (ref 13.0–17.0)
MCH: 28.7 pg (ref 26.0–34.0)
MCHC: 33 g/dL (ref 30.0–36.0)
MCV: 87 fL (ref 78.0–100.0)
Platelets: 283 10*3/uL (ref 150–400)
RBC: 5.29 MIL/uL (ref 4.22–5.81)
RDW: 13 % (ref 11.5–15.5)
WBC: 11.5 10*3/uL — ABNORMAL HIGH (ref 4.0–10.5)

## 2016-05-27 LAB — GLUCOSE, CAPILLARY: GLUCOSE-CAPILLARY: 267 mg/dL — AB (ref 65–99)

## 2016-05-27 NOTE — Progress Notes (Signed)
PCP:Dr. Emmie Niemann @ [facility] in [place], [place]  Denies cardiologist.  Pt. Reports he doesn't check blood sugars.

## 2016-05-28 LAB — HEMOGLOBIN A1C
HEMOGLOBIN A1C: 8 % — AB (ref 4.8–5.6)
MEAN PLASMA GLUCOSE: 183 mg/dL

## 2016-05-28 MED ORDER — DEXTROSE 5 % IV SOLN
3.0000 g | INTRAVENOUS | Status: AC
  Administered 2016-05-29: 3 g via INTRAVENOUS
  Filled 2016-05-28: qty 3000

## 2016-05-28 NOTE — Progress Notes (Addendum)
Anesthesia Chart Review: Patient is a 71 year old male scheduled for excision of left thigh mass on 05/29/16 by Dr. Brantley Stage. Dx: left thigh mass (core biopsy adipose tissue consistent with lipomatous tumor).  History includes never smoker, DM type 2, HTN, HLD, OSA ("mild"), prostate cancer s/p prostatectomy '05, tonsillectomy. BMI is consistent with obesity.   PCP is Bing Matter, PA-C at [facility].  Meds include ASA 81 mg, Lipitor, Zyrtec, lisinopril, metformin, Januvia.  BP 134/72   Pulse (!) 58   Temp 36.7 C   Resp 20   Ht 5\' 8"  (1.727 m)   Wt 227 lb 3.2 oz (103.1 kg)   SpO2 96%   BMI 34.55 kg/m   EKG 05/27/16: SB at 55 bpm.  Preoperative labs noted. Cr 1.23, glucose 329 (non-fasting), but A1c 8.0. H/H 15.2/46.0. He does not check his CBGs at home, so I do not know what his fasting glucoses are running. Labs are marked as reviewed by Dr. Brantley Stage in [place]. Patient will get a fasting CBG on arrival. If results acceptable then I would anticipate that he can proceed as planned, but will need continued PCP follow-up for DM. (I routed A1c results to Bing Matter.)  Myra Gianotti, PA-C [facility] Short Stay Center/Anesthesiology Phone [phone] 05/28/2016 10:59 AM

## 2016-05-29 ENCOUNTER — Ambulatory Visit (HOSPITAL_COMMUNITY): Payer: PPO | Admitting: Certified Registered Nurse Anesthetist

## 2016-05-29 ENCOUNTER — Encounter (HOSPITAL_COMMUNITY): Payer: Self-pay | Admitting: *Deleted

## 2016-05-29 ENCOUNTER — Ambulatory Visit (HOSPITAL_COMMUNITY)
Admission: RE | Admit: 2016-05-29 | Discharge: 2016-05-29 | Disposition: A | Discharge status: Home / Self Care | Payer: PPO | Source: Ambulatory Visit | Attending: Surgery | Admitting: Surgery

## 2016-05-29 ENCOUNTER — Ambulatory Visit (HOSPITAL_COMMUNITY): Payer: PPO | Admitting: Vascular Surgery

## 2016-05-29 ENCOUNTER — Encounter (HOSPITAL_COMMUNITY)
Admission: RE | Disposition: A | Discharge status: Home / Self Care | Payer: Self-pay | Source: Ambulatory Visit | Attending: Surgery

## 2016-05-29 DIAGNOSIS — Z79899 Other long term (current) drug therapy: Secondary | ICD-10-CM | POA: Insufficient documentation

## 2016-05-29 DIAGNOSIS — Z7984 Long term (current) use of oral hypoglycemic drugs: Secondary | ICD-10-CM | POA: Insufficient documentation

## 2016-05-29 DIAGNOSIS — I10 Essential (primary) hypertension: Secondary | ICD-10-CM | POA: Diagnosis not present

## 2016-05-29 DIAGNOSIS — R2242 Localized swelling, mass and lump, left lower limb: Secondary | ICD-10-CM | POA: Diagnosis not present

## 2016-05-29 DIAGNOSIS — E119 Type 2 diabetes mellitus without complications: Secondary | ICD-10-CM | POA: Insufficient documentation

## 2016-05-29 DIAGNOSIS — Z6834 Body mass index (BMI) 34.0-34.9, adult: Secondary | ICD-10-CM | POA: Diagnosis not present

## 2016-05-29 DIAGNOSIS — D179 Benign lipomatous neoplasm, unspecified: Secondary | ICD-10-CM | POA: Insufficient documentation

## 2016-05-29 DIAGNOSIS — D1724 Benign lipomatous neoplasm of skin and subcutaneous tissue of left leg: Secondary | ICD-10-CM | POA: Diagnosis not present

## 2016-05-29 DIAGNOSIS — D492 Neoplasm of unspecified behavior of bone, soft tissue, and skin: Secondary | ICD-10-CM | POA: Diagnosis not present

## 2016-05-29 DIAGNOSIS — R197 Diarrhea, unspecified: Secondary | ICD-10-CM | POA: Diagnosis not present

## 2016-05-29 DIAGNOSIS — E669 Obesity, unspecified: Secondary | ICD-10-CM | POA: Insufficient documentation

## 2016-05-29 HISTORY — PX: EXCISION MASS LOWER EXTREMETIES: SHX6705

## 2016-05-29 LAB — GLUCOSE, CAPILLARY
GLUCOSE-CAPILLARY: 222 mg/dL — AB (ref 65–99)
Glucose-Capillary: 187 mg/dL — ABNORMAL HIGH (ref 65–99)

## 2016-05-29 SURGERY — EXCISION MASS LOWER EXTREMITIES
Anesthesia: General | Site: Leg Upper | Laterality: Left

## 2016-05-29 MED ORDER — 0.9 % SODIUM CHLORIDE (POUR BTL) OPTIME
TOPICAL | Status: DC | PRN
  Administered 2016-05-29: 1000 mL

## 2016-05-29 MED ORDER — ONDANSETRON HCL 4 MG/2ML IJ SOLN
INTRAMUSCULAR | Status: AC
  Filled 2016-05-29: qty 2

## 2016-05-29 MED ORDER — FENTANYL CITRATE (PF) 100 MCG/2ML IJ SOLN: 25.0000 ug | INTRAMUSCULAR | Status: DC | PRN

## 2016-05-29 MED ORDER — MIDAZOLAM HCL 2 MG/2ML IJ SOLN
INTRAMUSCULAR | Status: AC
  Filled 2016-05-29: qty 2

## 2016-05-29 MED ORDER — OXYCODONE HCL 5 MG/5ML PO SOLN: 5.0000 mg | Freq: Once | ORAL | Status: AC | PRN

## 2016-05-29 MED ORDER — BUPIVACAINE HCL (PF) 0.25 % IJ SOLN
INTRAMUSCULAR | Status: DC | PRN
  Administered 2016-05-29: 20 mL

## 2016-05-29 MED ORDER — ONDANSETRON HCL 4 MG/2ML IJ SOLN
INTRAMUSCULAR | Status: DC | PRN
  Administered 2016-05-29: 4 mg via INTRAVENOUS

## 2016-05-29 MED ORDER — PROPOFOL 10 MG/ML IV BOLUS
INTRAVENOUS | Status: AC
  Filled 2016-05-29: qty 20

## 2016-05-29 MED ORDER — BUPIVACAINE HCL (PF) 0.25 % IJ SOLN
INTRAMUSCULAR | Status: AC
  Filled 2016-05-29: qty 30

## 2016-05-29 MED ORDER — MIDAZOLAM HCL 2 MG/2ML IJ SOLN
INTRAMUSCULAR | Status: DC | PRN
  Administered 2016-05-29: 2 mg via INTRAVENOUS

## 2016-05-29 MED ORDER — PROPOFOL 10 MG/ML IV BOLUS
INTRAVENOUS | Status: DC | PRN
  Administered 2016-05-29: 200 mg via INTRAVENOUS

## 2016-05-29 MED ORDER — OXYCODONE HCL 5 MG PO TABS
ORAL_TABLET | ORAL | Status: AC
  Filled 2016-05-29: qty 1

## 2016-05-29 MED ORDER — CHLORHEXIDINE GLUCONATE CLOTH 2 % EX PADS: 6.0000 | MEDICATED_PAD | Freq: Once | CUTANEOUS | Status: DC

## 2016-05-29 MED ORDER — ONDANSETRON HCL 4 MG/2ML IJ SOLN
4.0000 mg | Freq: Once | INTRAMUSCULAR | Status: AC | PRN
  Administered 2016-05-29: 4 mg via INTRAVENOUS

## 2016-05-29 MED ORDER — EPHEDRINE SULFATE 50 MG/ML IJ SOLN
INTRAMUSCULAR | Status: DC | PRN
  Administered 2016-05-29 (×2): 5 mg via INTRAVENOUS

## 2016-05-29 MED ORDER — OXYCODONE-ACETAMINOPHEN 5-325 MG PO TABS: 1.0000 | ORAL_TABLET | ORAL | 0 refills | Status: DC | PRN

## 2016-05-29 MED ORDER — LACTATED RINGERS IV SOLN
INTRAVENOUS | Status: DC
  Administered 2016-05-29 (×3): via INTRAVENOUS

## 2016-05-29 MED ORDER — FENTANYL CITRATE (PF) 100 MCG/2ML IJ SOLN
INTRAMUSCULAR | Status: AC
  Filled 2016-05-29: qty 2

## 2016-05-29 MED ORDER — LIDOCAINE HCL (CARDIAC) 20 MG/ML IV SOLN
INTRAVENOUS | Status: DC | PRN
  Administered 2016-05-29: 40 mg via INTRAVENOUS

## 2016-05-29 MED ORDER — OXYCODONE HCL 5 MG PO TABS
5.0000 mg | ORAL_TABLET | Freq: Once | ORAL | Status: AC | PRN
  Administered 2016-05-29: 5 mg via ORAL

## 2016-05-29 MED ORDER — PHENYLEPHRINE 40 MCG/ML (10ML) SYRINGE FOR IV PUSH (FOR BLOOD PRESSURE SUPPORT)
PREFILLED_SYRINGE | INTRAVENOUS | Status: AC
  Filled 2016-05-29: qty 10

## 2016-05-29 MED ORDER — FENTANYL CITRATE (PF) 100 MCG/2ML IJ SOLN
INTRAMUSCULAR | Status: DC | PRN
Start: 2016-05-29 — End: 2016-05-29
  Administered 2016-05-29: 50 ug via INTRAVENOUS
  Administered 2016-05-29: 100 ug via INTRAVENOUS
  Administered 2016-05-29 (×2): 25 ug via INTRAVENOUS

## 2016-05-29 SURGICAL SUPPLY — 41 items
ADH SKN CLS APL DERMABOND .7 (GAUZE/BANDAGES/DRESSINGS) ×1
BLADE CLIPPER SURG (BLADE) ×2 IMPLANT
CHLORAPREP W/TINT 26ML (MISCELLANEOUS) ×3 IMPLANT
COVER SURGICAL LIGHT HANDLE (MISCELLANEOUS) ×3 IMPLANT
DECANTER SPIKE VIAL GLASS SM (MISCELLANEOUS) ×3 IMPLANT
DERMABOND ADVANCED (GAUZE/BANDAGES/DRESSINGS) ×2
DERMABOND ADVANCED .7 DNX12 (GAUZE/BANDAGES/DRESSINGS) ×1 IMPLANT
DRAIN CHANNEL 19F RND (DRAIN) ×2 IMPLANT
DRAPE LAPAROSCOPIC ABDOMINAL (DRAPES) ×2 IMPLANT
DRAPE UTILITY XL STRL (DRAPES) ×4 IMPLANT
ELECT CAUTERY BLADE 6.4 (BLADE) ×3 IMPLANT
ELECT REM PT RETURN 9FT ADLT (ELECTROSURGICAL) ×3
ELECTRODE REM PT RTRN 9FT ADLT (ELECTROSURGICAL) ×1 IMPLANT
EVACUATOR SILICONE 100CC (DRAIN) ×2 IMPLANT
GLOVE BIO SURGEON STRL SZ8 (GLOVE) ×3 IMPLANT
GLOVE BIOGEL PI IND STRL 8 (GLOVE) ×1 IMPLANT
GLOVE BIOGEL PI INDICATOR 8 (GLOVE) ×2
GOWN STRL REUS W/ TWL LRG LVL3 (GOWN DISPOSABLE) ×2 IMPLANT
GOWN STRL REUS W/ TWL XL LVL3 (GOWN DISPOSABLE) ×1 IMPLANT
GOWN STRL REUS W/TWL LRG LVL3 (GOWN DISPOSABLE) ×6
GOWN STRL REUS W/TWL XL LVL3 (GOWN DISPOSABLE) ×3
KIT BASIN OR (CUSTOM PROCEDURE TRAY) ×3 IMPLANT
KIT ROOM TURNOVER OR (KITS) ×3 IMPLANT
NDL HYPO 25GX1X1/2 BEV (NEEDLE) ×1 IMPLANT
NEEDLE HYPO 25GX1X1/2 BEV (NEEDLE) ×3 IMPLANT
NS IRRIG 1000ML POUR BTL (IV SOLUTION) ×3 IMPLANT
PACK SURGICAL SETUP 50X90 (CUSTOM PROCEDURE TRAY) ×3 IMPLANT
PAD ARMBOARD 7.5X6 YLW CONV (MISCELLANEOUS) ×3 IMPLANT
PENCIL BUTTON HOLSTER BLD 10FT (ELECTRODE) ×3 IMPLANT
SPECIMEN JAR MEDIUM (MISCELLANEOUS) ×2 IMPLANT
SPONGE LAP 18X18 X RAY DECT (DISPOSABLE) ×5 IMPLANT
SUT ETHILON 2 0 FS 18 (SUTURE) ×2 IMPLANT
SUT MNCRL AB 4-0 PS2 18 (SUTURE) ×3 IMPLANT
SUT VIC AB 2-0 SH 27 (SUTURE) ×3
SUT VIC AB 2-0 SH 27X BRD (SUTURE) ×1 IMPLANT
SUT VIC AB 3-0 SH 27 (SUTURE) ×6
SUT VIC AB 3-0 SH 27X BRD (SUTURE) IMPLANT
SUT VIC AB 3-0 SH 27XBRD (SUTURE) ×1 IMPLANT
SYR BULB IRRIGATION 50ML (SYRINGE) ×2 IMPLANT
SYR CONTROL 10ML LL (SYRINGE) ×3 IMPLANT
TOWEL OR 17X26 10 PK STRL BLUE (TOWEL DISPOSABLE) ×3 IMPLANT

## 2016-05-29 NOTE — Discharge Instructions (Signed)
GENERAL SURGERY: POST OP INSTRUCTIONS ° °###################################################################### ° °EAT °Gradually transition to a high fiber diet with a fiber supplement over the next few weeks after discharge.  Start with a pureed / full liquid diet (see below) ° °WALK °Walk an hour a day.  Control your pain to do that.   ° °CONTROL PAIN °Control pain so that you can walk, sleep, tolerate sneezing/coughing, go up/down stairs. ° °HAVE A BOWEL MOVEMENT DAILY °Keep your bowels regular to avoid problems.  OK to try a laxative to override constipation.  OK to use an antidairrheal to slow down diarrhea.  Call if not better after 2 tries ° °CALL IF YOU HAVE PROBLEMS/CONCERNS °Call if you are still struggling despite following these instructions. °Call if you have concerns not answered by these instructions ° °###################################################################### ° ° ° °1. DIET: Follow a light bland diet the first 24 hours after arrival home, such as soup, liquids, crackers, etc.  Be sure to include lots of fluids daily.  Avoid fast food or heavy meals as your are more likely to get nauseated.   °2. Take your usually prescribed home medications unless otherwise directed. °3. PAIN CONTROL: °a. Pain is best controlled by a usual combination of three different methods TOGETHER: °i. Ice/Heat °ii. Over the counter pain medication °iii. Prescription pain medication °b. Most patients will experience some swelling and bruising around the incisions.  Ice packs or heating pads (30-60 minutes up to 6 times a day) will help. Use ice for the first few days to help decrease swelling and bruising, then switch to heat to help relax tight/sore spots and speed recovery.  Some people prefer to use ice alone, heat alone, alternating between ice & heat.  Experiment to what works for you.  Swelling and bruising can take several weeks to resolve.   °c. It is helpful to take an over-the-counter pain medication  regularly for the first few weeks.  Choose one of the following that works best for you: °i. Naproxen (Aleve, etc)  Two 220mg tabs twice a day °ii. Ibuprofen (Advil, etc) Three 200mg tabs four times a day (every meal & bedtime) °iii. Acetaminophen (Tylenol, etc) 500-650mg four times a day (every meal & bedtime) °d. A  prescription for pain medication (such as oxycodone, hydrocodone, etc) should be given to you upon discharge.  Take your pain medication as prescribed.  °i. If you are having problems/concerns with the prescription medicine (does not control pain, nausea, vomiting, rash, itching, etc), please call us [phone] to see if we need to switch you to a different pain medicine that will work better for you and/or control your side effect better. °ii. If you need a refill on your pain medication, please contact your pharmacy.  They will contact our office to request authorization. Prescriptions will not be filled after 5 pm or on week-ends. °4. Avoid getting constipated.  Between the surgery and the pain medications, it is common to experience some constipation.  Increasing fluid intake and taking a fiber supplement (such as Metamucil, Citrucel, FiberCon, MiraLax, etc) 1-2 times a day regularly will usually help prevent this problem from occurring.  A mild laxative (prune juice, Milk of Magnesia, MiraLax, etc) should be taken according to package directions if there are no bowel movements after 48 hours.   °5. Wash / shower every day.  You may shower over the dressings as they are waterproof.  Continue to shower over incision(s) after the dressing is off. °6. Remove your waterproof bandages   5 days after surgery.  You may leave the incision open to air.  You may have skin tapes (Steri Strips) covering the incision(s).  Leave them on until one week, then remove.  You may replace a dressing/Band-Aid to cover the incision for comfort if you wish.      7. ACTIVITIES as tolerated:   a. You may resume  regular (light) daily activities beginning the next day--such as daily self-care, walking, climbing stairs--gradually increasing activities as tolerated.  If you can walk 30 minutes without difficulty, it is safe to try more intense activity such as jogging, treadmill, bicycling, low-impact aerobics, swimming, etc. b. Save the most intensive and strenuous activity for last such as sit-ups, heavy lifting, contact sports, etc  Refrain from any heavy lifting or straining until you are off narcotics for pain control.   c. DO NOT PUSH THROUGH PAIN.  Let pain be your guide: If it hurts to do something, don't do it.  Pain is your body warning you to avoid that activity for another week until the pain goes down. d. You may drive when you are no longer taking prescription pain medication, you can comfortably wear a seatbelt, and you can safely maneuver your car and apply brakes. e. Dennis Bast may have sexual intercourse when it is comfortable.  8. FOLLOW UP in our office a. Please call [facility] at [phone] to set up an appointment to see your surgeon in the office for a follow-up appointment approximately 2-3 weeks after your surgery. b. Make sure that you call for this appointment the day you arrive home to insure a convenient appointment time. 9. IF YOU HAVE DISABILITY OR FAMILY LEAVE FORMS, BRING THEM TO THE OFFICE FOR PROCESSING.  DO NOT GIVE THEM TO YOUR DOCTOR.   WHEN TO CALL us [phone]: 1. Poor pain control 2. Reactions / problems with new medications (rash/itching, nausea, etc)  3. Fever over 101.5 F (38.5 C) 4. Worsening swelling or bruising 5. Continued bleeding from incision. 6. Increased pain, redness, or drainage from the incision 7. Difficulty breathing / swallowing   The clinic staff is available to answer your questions during regular business hours (8:30am-5pm).  Please dont hesitate to call and ask to speak to one of our nurses for clinical concerns.   If you have a medical emergency,  go to the nearest emergency room or call 911.  A surgeon from [facility] is always on call at the [facility], [place], [place], [place], [place]  [address] ? MAIN: [phone] ? TOLL FREE: [phone] ?  FAX [phone] [email or web address]    Empty drain daily   Record output  Ok to shower  [facility] Surgical drains are used to remove extra fluid that normally builds up in a surgical wound after surgery. A surgical drain helps to heal a surgical wound. Different kinds of surgical drains include:  Active drains. These drains use suction to pull drainage away from the surgical wound. Drainage flows through a tube to a container outside of the body. It is important to keep the bulb or the drainage container flat (compressed) at all times, except while you empty it. Flattening the bulb or container creates suction. The two most common types of active drains are bulb drains and Hemovac drains.  Passive drains. These drains allow fluid to drain naturally, by gravity. Drainage flows through a tube to a bandage (dressing) or a container outside of  the body. Passive drains do not need to be emptied. The most common type of passive drain is the Penrose drain. A drain is placed during surgery. Immediately after surgery, drainage is usually bright red and a little thicker than water. The drainage may gradually turn yellow or pink and become thinner. It is likely that your health care provider will remove the drain when the drainage stops or when the amount decreases to 1-2 Tbsp (15-30 mL) during a 24-hour period. How to care for your surgical drain  Keep the skin around the drain dry and covered with a dressing at all times.  Check your drain area every day for signs of infection. Check for:  More redness, swelling, or pain.  Pus or a bad smell.  Cloudy drainage. Follow instructions from your health care  provider about how to take care of your drain and how to change your dressing. Change your dressing at least one time every day. Change it more often if needed to keep the dressing dry. Make sure you: 1. Gather your supplies, including:  Tape.  Germ-free cleaning solution (sterile saline).  Split gauze drain sponge: 4 x 4 inches (10 x 10 cm).  Gauze square: 4 x 4 inches (10 x 10 cm). 2. Wash your hands with soap and water before you change your dressing. If soap and water are not available, use hand sanitizer. 3. Remove the old dressing. Avoid using scissors to do that. 4. Use sterile saline to clean your skin around the drain. 5. Place the tube through the slit in a drain sponge. Place the drain sponge so that it covers your wound. 6. Place the gauze square or another drain sponge on top of the drain sponge that is on the wound. Make sure the tube is between those layers. 7. Tape the dressing to your skin. 8. If you have an active bulb or Hemovac drain, tape the drainage tube to your skin 1-2 inches (2.5-5 cm) below the place where the tube enters your body. Taping keeps the tube from pulling on any stitches (sutures) that you have. 9. Wash your hands with soap and water. 10. Write down the color of your drainage and how often you change your dressing. How to empty your active bulb or Hemovac drain 1. Make sure that you have a measuring cup that you can empty your drainage into. 2. Wash your hands with soap and water. If soap and water are not available, use hand sanitizer. 3. Gently move your fingers down the tube while squeezing very lightly. This is called stripping the tube. This clears any drainage, clots, or tissue from the tube.  Do not pull on the tube.  You may need to strip the tube several times every day to keep the tube clear. 4. Open the bulb cap or the drain plug. Do not touch the inside of the cap or the bottom of the plug. 5. Empty all of the drainage into the measuring  cup. 6. Compress the bulb or the container and replace the cap or the plug. To compress the bulb or the container, squeeze it firmly in the middle while you close the cap or plug the container. 7. Write down the amount of drainage that you have in each 24-hour period. If you have less than 2 Tbsp (30 mL) of drainage during 24 hours, contact your health care provider. 8. Flush the drainage down the toilet. 9. Wash your hands with soap and water. Contact a health care provider  if:  You have more redness, swelling, or pain around your drain area.  The amount of drainage that you have is increasing instead of decreasing.  You have pus or a bad smell coming from your drain area.  You have a fever.  You have drainage that is cloudy.  There is a sudden stop or a sudden decrease in the amount of drainage that you have.  Your tube falls out.  Your active draindoes not stay compressedafter you empty it. This information is not intended to replace advice given to you by your health care provider. Make sure you discuss any questions you have with your health care provider. Document Released: 03/14/2000 Document Revised: 08/23/2015 Document Reviewed: 10/04/2014 Elsevier Interactive Patient Education  2017 [employer] Surgical drains are used to remove extra fluid that normally builds up in a surgical wound after surgery. A surgical drain helps to heal a surgical wound. Different kinds of surgical drains include:  Active drains. These drains use suction to pull drainage away from the surgical wound. Drainage flows through a tube to a container outside of the body. It is important to keep the bulb or the drainage container flat (compressed) at all times, except while you empty it. Flattening the bulb or container creates suction. The two most common types of active drains are bulb drains and Hemovac drains.  Passive drains. These drains allow fluid to drain naturally,  by gravity. Drainage flows through a tube to a bandage (dressing) or a container outside of the body. Passive drains do not need to be emptied. The most common type of passive drain is the Penrose drain. A drain is placed during surgery. Immediately after surgery, drainage is usually bright red and a little thicker than water. The drainage may gradually turn yellow or pink and become thinner. It is likely that your health care provider will remove the drain when the drainage stops or when the amount decreases to 1-2 Tbsp (15-30 mL) during a 24-hour period. How to care for your surgical drain  Keep the skin around the drain dry and covered with a dressing at all times.  Check your drain area every day for signs of infection. Check for:  More redness, swelling, or pain.  Pus or a bad smell.  Cloudy drainage. Follow instructions from your health care provider about how to take care of your drain and how to change your dressing. Change your dressing at least one time every day. Change it more often if needed to keep the dressing dry. Make sure you: 11. Gather your supplies, including:  Tape.  Germ-free cleaning solution (sterile saline).  Split gauze drain sponge: 4 x 4 inches (10 x 10 cm).  Gauze square: 4 x 4 inches (10 x 10 cm). 12. Wash your hands with soap and water before you change your dressing. If soap and water are not available, use hand sanitizer. 56. Remove the old dressing. Avoid using scissors to do that. 14. Use sterile saline to clean your skin around the drain. 15. Place the tube through the slit in a drain sponge. Place the drain sponge so that it covers your wound. 16. Place the gauze square or another drain sponge on top of the drain sponge that is on the wound. Make sure the tube is between those layers. 17. Tape the dressing to your skin. 18. If you have an active bulb or Hemovac drain, tape the drainage tube to your skin 1-2 inches (2.5-5 cm)  below the place where the  tube enters your body. Taping keeps the tube from pulling on any stitches (sutures) that you have. 77. Wash your hands with soap and water. 20. Write down the color of your drainage and how often you change your dressing. How to empty your active bulb or Hemovac drain 10. Make sure that you have a measuring cup that you can empty your drainage into. 11. Wash your hands with soap and water. If soap and water are not available, use hand sanitizer. 12. Gently move your fingers down the tube while squeezing very lightly. This is called stripping the tube. This clears any drainage, clots, or tissue from the tube.  Do not pull on the tube.  You may need to strip the tube several times every day to keep the tube clear. 13. Open the bulb cap or the drain plug. Do not touch the inside of the cap or the bottom of the plug. 14. Empty all of the drainage into the measuring cup. 15. Compress the bulb or the container and replace the cap or the plug. To compress the bulb or the container, squeeze it firmly in the middle while you close the cap or plug the container. 16. Write down the amount of drainage that you have in each 24-hour period. If you have less than 2 Tbsp (30 mL) of drainage during 24 hours, contact your health care provider. 17. Flush the drainage down the toilet. 46. Wash your hands with soap and water. Contact a health care provider if:  You have more redness, swelling, or pain around your drain area.  The amount of drainage that you have is increasing instead of decreasing.  You have pus or a bad smell coming from your drain area.  You have a fever.  You have drainage that is cloudy.  There is a sudden stop or a sudden decrease in the amount of drainage that you have.  Your tube falls out.  Your active draindoes not stay compressedafter you empty it. This information is not intended to replace advice given to you by your health care provider. Make sure you discuss any  questions you have with your health care provider. Document Released: 03/14/2000 Document Revised: 08/23/2015 Document Reviewed: 10/04/2014 Elsevier Interactive Patient Education  2017 [employer].

## 2016-05-29 NOTE — Anesthesia Preprocedure Evaluation (Addendum)
Anesthesia Evaluation  Patient identified by MRN, date of birth, ID band Patient awake    Reviewed: Allergy & Precautions, NPO status , Patient's Chart, lab work & pertinent test results  Airway Mallampati: III       Dental  (+) Teeth Intact, Dental Advisory Given   Pulmonary    breath sounds clear to auscultation       Cardiovascular hypertension,  Rhythm:Regular Rate:Normal     Neuro/Psych    GI/Hepatic   Endo/Other  diabetes  Renal/GU      Musculoskeletal   Abdominal (+) + obese,   Peds  Hematology   Anesthesia Other Findings   Reproductive/Obstetrics                            Anesthesia Physical Anesthesia Plan  ASA: III  Anesthesia Plan: General   Post-op Pain Management:    Induction: Intravenous  Airway Management Planned: LMA  Additional Equipment:   Intra-op Plan:   Post-operative Plan:   Informed Consent: I have reviewed the patients History and Physical, chart, labs and discussed the procedure including the risks, benefits and alternatives for the proposed anesthesia with the patient or authorized representative who has indicated his/her understanding and acceptance.   Dental advisory given  Plan Discussed with: CRNA and Anesthesiologist  Anesthesia Plan Comments:         Anesthesia Quick Evaluation

## 2016-05-29 NOTE — Interval H&P Note (Signed)
History and Physical Interval Note:  05/29/2016 11:52 AM  Jimmy Oconnor  has presented today for surgery, with the diagnosis of Left thigh mass  The various methods of treatment have been discussed with the patient and family. After consideration of risks, benefits and other options for treatment, the patient has consented to  Procedure(s): EXCISION LEFT THIGH MASS (Left) as a surgical intervention .  The patient's history has been reviewed, patient examined, no change in status, stable for surgery.  I have reviewed the patient's chart and labs.  Questions were answered to the patient's satisfaction.     Eyvette Cordon A.

## 2016-05-29 NOTE — H&P (View-Only) (Signed)
Jimmy Oconnor 05/12/2016 2:45 PM Location: [facility] Patient #: [id number] DOB: 1946-03-22 Married / Language: English / Race: White Male  History of Present Illness Jimmy Moores A. Rea Kalama MD; 05/12/2016 3:18 PM) Patient words: Patient returns after core biopsy of large left medial thigh mass. 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 He returns today to discuss options. His symptoms have not changed. Denies any new or neurological problems.            CLINICAL DATA: Left hip pain with swollen inner upper thigh, palpable mass was marked with capsules, pt says the mass appeared about 1 year ago but continues to increase in size. Pt has hx prostate cancer. EXAM: MR OF THE LEFT LOWER EXTREMITY WITHOUT AND WITH CONTRAST TECHNIQUE: Multiplanar, multisequence MR imaging of the left thigh was performed both before and after administration of intravenous contrast. CONTRAST: 52mL MULTIHANCE GADOBENATE DIMEGLUMINE 529 MG/ML IV SOLN COMPARISON: None. FINDINGS: Bones/Joint/Cartilage No marrow signal abnormality. No fracture or dislocation. Normal alignment. No joint effusion. Ligaments, Muscles and Tendons 7.6 x 10 x 16 cm T1 hyperintense mass in the left adductor magnus muscle with complete signal dropout on fat saturated images. Few thin internal septations. Thin spiculated areas of enhancement in the superior most aspect of the mass. Muscles are otherwise normal. Soft tissue No fluid collection or hematoma. No other soft tissue mass. IMPRESSION: 7.6 x 10 x 16 cm soft tissue mass located in the left adductor magnus muscle with thin enhancing spiculation in the superior most aspect of the mass which may reflect a simple lipoma with small areas of fat necrosis versus atypical lipoma. Electronically Signed By: Kathreen Devoid On: 03/19/2016 14:36  Orders Requiring a Screening Form  Procedure Order Status Form Status MR  FEMUR LEFT W WO CONTRAST <[email or web address]> Completed Completed     Patient: JAEGER, ARNESON Collected: 04/22/2016 Client: Louise Accession: [id number] Received: 04/22/2016 Jimmy Sells, MD DOB: 03-30-1946 Age: 77 Gender: M Reported: 04/24/2016 [address] Patient Ph: [phone] MRN #: [id number] [place], [place] [address] Visit #: [id number] Chart #: Phone: [phone] Fax: CC: Erroll Luna, MD REPORT OF SURGICAL PATHOLOGY FINAL DIAGNOSIS Diagnosis Soft Tissue Needle Core Biopsy, Left Thigh Mass - ADIPOSE TISSUE CONSISTENT WITH LIPOMATOUS TUMOR. Microscopic Comment The core biopsies show adipose tissue without atypia or increased cellularity. The findings in these biopsies are consistent with lipoma. Dr. Gari Crown agrees. (JDP:gt, 04/24/16) Claudette Laws MD Pathologist, Electronic Signature (Case signed 04/24/2016) Specimen Gross and Clinical Information Specimen(s) Obtained: Soft Tissue Needle Core Biopsy, Left Thigh Mass Specimen Clinical Information Lipoma vs Low Grade Liposarcoma (nt) Gross.  The patient is a 71 year old male.   Allergies Jimmy Oconnor, [place]; 05/12/2016 2:45 PM) No Known Drug Allergies 04/15/2016  Medication History Jimmy Oconnor, CMA; 05/12/2016 2:46 PM) Atorvastatin Calcium (20MG  Tablet, Oral daily) Active. Januvia (100MG  Tablet, Oral daily) Active. MetFORMIN HCl ER (500MG  Tablet ER 24HR, Oral daily) Active. Lisinopril (5MG  Tablet, Oral) Active. Aspirin (81MG  Tablet, Oral) Active. Medications Reconciled    Vitals Jimmy Oconnor CMA; 05/12/2016 2:46 PM) 05/12/2016 2:46 PM Weight: 227.4 lb Height: 68in Body Surface Area: 2.16 m Body Mass Index: 34.58 kg/m  Temp.: 98.109F  Pulse: 63 (Regular)  BP: 150/82 (Sitting, Left Arm, Standard)      Physical Exam (Ahnyla Mendel A. Cheryn Lundquist MD; 05/12/2016 3:19 PM)  Head and Neck Head-normocephalic, atraumatic with no lesions or palpable  masses.  Cardiovascular Cardiovascular examination reveals -on palpation PMI is  normal in location and amplitude, no palpable S3 or S4. Normal cardiac borders., normal heart sounds, regular rate and rhythm with no murmurs, carotid auscultation reveals no bruits and normal pedal pulses bilaterally.  Neurologic Neurologic evaluation reveals -alert and oriented x 3 with no impairment of recent or remote memory. Mental Status-Normal.  Musculoskeletal Note: Large left medial thigh mass. Partially submuscular.    Assessment & Plan (Hannibal Skalla A. 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 I reviewed the MRI images. Given its large size and location, recommend core biopsy preoperatively to do decide on proper surgical technique. 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 He has no neurological symptoms or any signs of vascular disease involving that leg on physical examination today. I discussed the rationale for doing the biopsy this circumstance given the benign appearance on MRI. They would like to proceed with a biopsy prior to any major surgery anyway. Once the biopsy is done, we'll have more information and compliant surgical approach. I discussed possible risk of bleeding, infection, nerve injury leading to dysfunction, numbness, I believe further operations and/or procedures, the knee further possible treatments, blood clots, stroke, DVT, wound complications, use of a drain, and anesthesia risk.  Current Plans Pt Education - [facility] Education - Written Instructions given: discussed with patient and provided information. The pathophysiology of skin & subcutaneous masses was discussed. Natural history risks without surgery were discussed. I recommended surgery to remove the  mass. I explained the technique of removal with use of local anesthesia & possible need for more aggressive sedation/anesthesia for patient comfort.  Risks such as bleeding, infection, wound breakdown, heart attack, death, and other risks were discussed. I noted a good likelihood this will help address the problem. Possibility that this will not correct all symptoms was explained. Possibility of regrowth/recurrence of the mass was discussed. We will work to minimize complications. Questions were answered. The patient expresses understanding & wishes to proceed with surgery.

## 2016-05-29 NOTE — Op Note (Addendum)
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  Anesthesia: LMA with 0.25% Sensorcaine local plain  EBL: 30 mL  Drain: 19 round drain to operative bed  Specimen: Large multi loculated lipoma to pathology  Indications for procedure: The patient's a 71 year old male with a slowly enlarging left medial thigh mass. Workup included MRI which showed a multiloculated intramuscular lipoma which extended between the circumflex the circumflex notorious muscle, the abductor longus, and the vastus medialis. The mass is well away from the neurovascular structures of the thigh on MRI. There is no evidence of any inclusion or invasion of these structures. The mass resided in the medial compartment of the left medial thigh. I discussed options of excision. A biopsy was done preop to exclude a liposarcoma which showed benign tissue consistent with lipoma. I reviewed the risk of the procedure with the patient,complications and expectations.  The procedure has been discussed with the patient.  Alternative therapies have been discussed with the patient.  Operative risks include bleeding,  Infection,  Organ injury,  Nerve injury,  Weakness or numbness of limb Blood vessel injury,  DVT,  Pulmonary embolism,  Death,  And possible reoperation.  Medical management risks include worsening of present situation.  The success of the procedure is 50 -90 % at treating patients symptoms.  The patient understands and agrees to proceed.  Description of procedure: The patient was met in the holding area and questions were answered. Left eye was marked as the correct side. He is taken back to the operating room placed upon the OR table. After induction of general anesthesia, the left leg was placed in a frog legged position. The mass in the medial  thigh was prepped and draped in a sterile fashion. Timeout was done. Incision was made over the medial left thigh overlying the mass. Dissection was carried down through the subcutaneous tissues. After encountering the circulatory was muscle, the mass was just medial to this. I was able to open the tissue over the mass which revealed a large multiloculated lipoma. This had nice capsule on there is no evidence of any invasion of the neighboring structures are tissue. This extended down into the abductor longus muscle compartment was not invading the muscle. Extended extended superiorly superiorly and inferiorly as well. I was able to place my finger around the mass easily and I encountered no structures. I very carefully under direct vision using blunt  dissection to dissect out the mass. Once I was  able to deliver the mass into the  open wound, I was able to remove it. Hemostasis was achieved. We were well away from the neurovascular bundle of the  thigh which was more posterior and more medial than our location. I could easily palpate the common femoral vessls and was away from all  neurovascular structures.  The specimen was passed off the field. The wound was irrigated and made hemostatic. Through separate stab incision and 19 round drain was placed into the operative space. This is secured to skin with 2-0 nylon. 3-0 Vicryl used to approximate the deep tissues of the drain. 4 Monocryl used to close skin in a  Subcuticular  Fashion. All final counts are found to be correct.  Dermabond applied.  The patient was taken to recovery after extubation satisfactory condition.   [place] narcotic  database quierried No  issues found

## 2016-05-29 NOTE — Transfer of Care (Signed)
Immediate Anesthesia Transfer of Care Note  Patient: Jimmy Oconnor  Procedure(s) Performed: Procedure(s): EXCISION LEFT THIGH MASS (Left)  Patient Location: PACU  Anesthesia Type:General  Level of Consciousness: awake and alert   Airway & Oxygen Therapy: Patient Spontanous Breathing and Patient connected to nasal cannula oxygen  Post-op Assessment: Report given to RN, Post -op Vital signs reviewed and stable and Patient moving all extremities X 4  Post vital signs: Reviewed and stable  Last Vitals:  Vitals:   05/29/16 1108 05/29/16 1405  BP: 126/69 (!) 140/96  Pulse: (!) 54 65  Resp: 20 14  Temp: 36.4 C 36.6 C    Last Pain:  Vitals:   05/29/16 1108  TempSrc: Oral      Patients Stated Pain Goal: 8 ([id number])  Complications: No apparent anesthesia complications

## 2016-05-29 NOTE — Anesthesia Procedure Notes (Signed)
Procedure Name: LMA Insertion Date/Time: 05/29/2016 12:31 PM Performed by: Rejeana Brock L Pre-anesthesia Checklist: Patient identified, Emergency Drugs available, Suction available and Patient being monitored Patient Re-evaluated:Patient Re-evaluated prior to inductionOxygen Delivery Method: Circle System Utilized Preoxygenation: Pre-oxygenation with 100% oxygen Intubation Type: IV induction Ventilation: Mask ventilation without difficulty and Oral airway inserted - appropriate to patient size LMA: LMA inserted LMA Size: 5.0 Number of attempts: 1 Airway Equipment and Method: Bite block Placement Confirmation: positive ETCO2 and breath sounds checked- equal and bilateral Tube secured with: Tape Dental Injury: Teeth and Oropharynx as per pre-operative assessment

## 2016-05-30 ENCOUNTER — Encounter (HOSPITAL_COMMUNITY): Payer: Self-pay | Admitting: Surgery

## 2016-06-02 NOTE — Anesthesia Postprocedure Evaluation (Signed)
Anesthesia Post Note  Patient: Jimmy Oconnor  Procedure(s) Performed: Procedure(s) (LRB): EXCISION LEFT THIGH MASS (Left)  Anesthesia Post Evaluation     Last Vitals:  Vitals:   05/29/16 1430 05/29/16 1440  BP: 125/72 137/81  Pulse: (!) 58 (!) 59  Resp: 15 16  Temp: 36.6 C 36.6 C    Last Pain:  Vitals:   05/29/16 1108  TempSrc: Oral                 Bailey Faiella COKER

## 2016-06-02 NOTE — Anesthesia Postprocedure Evaluation (Signed)
Anesthesia Post Note  Patient: Jimmy Oconnor  Procedure(s) Performed: Procedure(s) (LRB): EXCISION LEFT THIGH MASS (Left)  Patient location during evaluation: PACU Anesthesia Type: General Level of consciousness: awake, awake and alert and oriented Pain management: pain level controlled Vital Signs Assessment: post-procedure vital signs reviewed and stable Respiratory status: spontaneous breathing, nonlabored ventilation and respiratory function stable Cardiovascular status: blood pressure returned to baseline Anesthetic complications: no       Last Vitals:  Vitals:   05/29/16 1430 05/29/16 1440  BP: 125/72 137/81  Pulse: (!) 58 (!) 59  Resp: 15 16  Temp: 36.6 C 36.6 C    Last Pain:  Vitals:   05/29/16 1108  TempSrc: Oral                 Minah Axelrod COKER

## 2016-08-08 DIAGNOSIS — J029 Acute pharyngitis, unspecified: Secondary | ICD-10-CM | POA: Diagnosis not present

## 2016-08-08 DIAGNOSIS — J069 Acute upper respiratory infection, unspecified: Secondary | ICD-10-CM | POA: Diagnosis not present

## 2016-09-05 DIAGNOSIS — Z125 Encounter for screening for malignant neoplasm of prostate: Secondary | ICD-10-CM | POA: Diagnosis not present

## 2016-09-05 DIAGNOSIS — E785 Hyperlipidemia, unspecified: Secondary | ICD-10-CM | POA: Diagnosis not present

## 2016-09-05 DIAGNOSIS — E1165 Type 2 diabetes mellitus with hyperglycemia: Secondary | ICD-10-CM | POA: Diagnosis not present

## 2016-09-10 DIAGNOSIS — E119 Type 2 diabetes mellitus without complications: Secondary | ICD-10-CM | POA: Diagnosis not present

## 2016-09-10 DIAGNOSIS — E7800 Pure hypercholesterolemia, unspecified: Secondary | ICD-10-CM | POA: Diagnosis not present

## 2016-10-06 DIAGNOSIS — H5203 Hypermetropia, bilateral: Secondary | ICD-10-CM | POA: Diagnosis not present

## 2016-10-06 DIAGNOSIS — Z7984 Long term (current) use of oral hypoglycemic drugs: Secondary | ICD-10-CM | POA: Diagnosis not present

## 2016-10-06 DIAGNOSIS — H40003 Preglaucoma, unspecified, bilateral: Secondary | ICD-10-CM | POA: Diagnosis not present

## 2016-10-06 DIAGNOSIS — H527 Unspecified disorder of refraction: Secondary | ICD-10-CM | POA: Diagnosis not present

## 2016-10-06 DIAGNOSIS — Z9889 Other specified postprocedural states: Secondary | ICD-10-CM | POA: Diagnosis not present

## 2016-10-06 DIAGNOSIS — H2513 Age-related nuclear cataract, bilateral: Secondary | ICD-10-CM | POA: Diagnosis not present

## 2016-10-06 DIAGNOSIS — E119 Type 2 diabetes mellitus without complications: Secondary | ICD-10-CM | POA: Diagnosis not present

## 2016-10-06 DIAGNOSIS — H524 Presbyopia: Secondary | ICD-10-CM | POA: Diagnosis not present

## 2017-01-26 DIAGNOSIS — Z23 Encounter for immunization: Secondary | ICD-10-CM | POA: Diagnosis not present

## 2017-01-26 DIAGNOSIS — M546 Pain in thoracic spine: Secondary | ICD-10-CM | POA: Diagnosis not present

## 2017-03-06 DIAGNOSIS — E119 Type 2 diabetes mellitus without complications: Secondary | ICD-10-CM | POA: Diagnosis not present

## 2017-03-06 DIAGNOSIS — E7800 Pure hypercholesterolemia, unspecified: Secondary | ICD-10-CM | POA: Diagnosis not present

## 2017-03-19 DIAGNOSIS — E119 Type 2 diabetes mellitus without complications: Secondary | ICD-10-CM | POA: Diagnosis not present

## 2017-03-19 DIAGNOSIS — E7800 Pure hypercholesterolemia, unspecified: Secondary | ICD-10-CM | POA: Diagnosis not present

## 2017-07-01 DIAGNOSIS — R399 Unspecified symptoms and signs involving the genitourinary system: Secondary | ICD-10-CM | POA: Diagnosis not present

## 2017-07-01 DIAGNOSIS — R300 Dysuria: Secondary | ICD-10-CM | POA: Diagnosis not present

## 2017-09-07 DIAGNOSIS — E7800 Pure hypercholesterolemia, unspecified: Secondary | ICD-10-CM | POA: Diagnosis not present

## 2017-09-07 DIAGNOSIS — Z125 Encounter for screening for malignant neoplasm of prostate: Secondary | ICD-10-CM | POA: Diagnosis not present

## 2017-09-07 DIAGNOSIS — E119 Type 2 diabetes mellitus without complications: Secondary | ICD-10-CM | POA: Diagnosis not present

## 2017-09-09 DIAGNOSIS — E1165 Type 2 diabetes mellitus with hyperglycemia: Secondary | ICD-10-CM | POA: Diagnosis not present

## 2017-09-09 DIAGNOSIS — Z0000 Encounter for general adult medical examination without abnormal findings: Secondary | ICD-10-CM | POA: Diagnosis not present

## 2017-09-09 DIAGNOSIS — E7800 Pure hypercholesterolemia, unspecified: Secondary | ICD-10-CM | POA: Diagnosis not present

## 2017-09-22 DIAGNOSIS — H40003 Preglaucoma, unspecified, bilateral: Secondary | ICD-10-CM | POA: Diagnosis not present

## 2017-09-22 DIAGNOSIS — H04123 Dry eye syndrome of bilateral lacrimal glands: Secondary | ICD-10-CM | POA: Diagnosis not present

## 2017-09-22 DIAGNOSIS — H524 Presbyopia: Secondary | ICD-10-CM | POA: Diagnosis not present

## 2017-09-22 DIAGNOSIS — E119 Type 2 diabetes mellitus without complications: Secondary | ICD-10-CM | POA: Diagnosis not present

## 2017-09-22 DIAGNOSIS — Z9889 Other specified postprocedural states: Secondary | ICD-10-CM | POA: Diagnosis not present

## 2017-09-22 DIAGNOSIS — H5203 Hypermetropia, bilateral: Secondary | ICD-10-CM | POA: Diagnosis not present

## 2017-09-22 DIAGNOSIS — H526 Other disorders of refraction: Secondary | ICD-10-CM | POA: Diagnosis not present

## 2017-09-22 DIAGNOSIS — H2513 Age-related nuclear cataract, bilateral: Secondary | ICD-10-CM | POA: Diagnosis not present

## 2017-12-01 DIAGNOSIS — H40003 Preglaucoma, unspecified, bilateral: Secondary | ICD-10-CM | POA: Diagnosis not present

## 2017-12-01 DIAGNOSIS — H04123 Dry eye syndrome of bilateral lacrimal glands: Secondary | ICD-10-CM | POA: Diagnosis not present

## 2017-12-01 DIAGNOSIS — Z7984 Long term (current) use of oral hypoglycemic drugs: Secondary | ICD-10-CM | POA: Diagnosis not present

## 2017-12-01 DIAGNOSIS — H52203 Unspecified astigmatism, bilateral: Secondary | ICD-10-CM | POA: Diagnosis not present

## 2017-12-01 DIAGNOSIS — H2513 Age-related nuclear cataract, bilateral: Secondary | ICD-10-CM | POA: Diagnosis not present

## 2017-12-01 DIAGNOSIS — H524 Presbyopia: Secondary | ICD-10-CM | POA: Diagnosis not present

## 2017-12-01 DIAGNOSIS — E119 Type 2 diabetes mellitus without complications: Secondary | ICD-10-CM | POA: Diagnosis not present

## 2017-12-01 DIAGNOSIS — H5203 Hypermetropia, bilateral: Secondary | ICD-10-CM | POA: Diagnosis not present

## 2017-12-01 DIAGNOSIS — Z9889 Other specified postprocedural states: Secondary | ICD-10-CM | POA: Diagnosis not present

## 2018-02-16 DIAGNOSIS — E7800 Pure hypercholesterolemia, unspecified: Secondary | ICD-10-CM | POA: Diagnosis not present

## 2018-02-16 DIAGNOSIS — E785 Hyperlipidemia, unspecified: Secondary | ICD-10-CM | POA: Diagnosis not present

## 2018-02-16 DIAGNOSIS — E1165 Type 2 diabetes mellitus with hyperglycemia: Secondary | ICD-10-CM | POA: Diagnosis not present

## 2018-02-19 ENCOUNTER — Other Ambulatory Visit: Payer: Self-pay | Admitting: Family Medicine

## 2018-02-19 ENCOUNTER — Ambulatory Visit
Admission: RE | Admit: 2018-02-19 | Discharge: 2018-02-19 | Disposition: A | Discharge status: Home / Self Care | Payer: PPO | Source: Ambulatory Visit | Attending: Family Medicine | Admitting: Family Medicine

## 2018-02-19 DIAGNOSIS — M79671 Pain in right foot: Secondary | ICD-10-CM | POA: Diagnosis not present

## 2018-02-19 DIAGNOSIS — M25562 Pain in left knee: Secondary | ICD-10-CM | POA: Diagnosis not present

## 2018-02-19 DIAGNOSIS — M25561 Pain in right knee: Secondary | ICD-10-CM

## 2018-02-19 DIAGNOSIS — M79672 Pain in left foot: Secondary | ICD-10-CM | POA: Diagnosis not present

## 2018-02-19 DIAGNOSIS — E119 Type 2 diabetes mellitus without complications: Secondary | ICD-10-CM | POA: Diagnosis not present

## 2018-02-19 DIAGNOSIS — Z23 Encounter for immunization: Secondary | ICD-10-CM | POA: Diagnosis not present

## 2018-02-19 DIAGNOSIS — M1712 Unilateral primary osteoarthritis, left knee: Secondary | ICD-10-CM | POA: Diagnosis not present

## 2018-04-08 DIAGNOSIS — H168 Other keratitis: Secondary | ICD-10-CM | POA: Diagnosis not present

## 2018-05-20 DIAGNOSIS — E7800 Pure hypercholesterolemia, unspecified: Secondary | ICD-10-CM | POA: Diagnosis not present

## 2018-05-20 DIAGNOSIS — E1165 Type 2 diabetes mellitus with hyperglycemia: Secondary | ICD-10-CM | POA: Diagnosis not present

## 2018-05-28 DIAGNOSIS — D23111 Other benign neoplasm of skin of right upper eyelid, including canthus: Secondary | ICD-10-CM | POA: Diagnosis not present

## 2018-06-09 DIAGNOSIS — H0232 Blepharochalasis right lower eyelid: Secondary | ICD-10-CM | POA: Diagnosis not present

## 2018-06-09 DIAGNOSIS — H0231 Blepharochalasis right upper eyelid: Secondary | ICD-10-CM | POA: Diagnosis not present

## 2018-06-09 DIAGNOSIS — H53481 Generalized contraction of visual field, right eye: Secondary | ICD-10-CM | POA: Diagnosis not present

## 2018-06-09 DIAGNOSIS — H0234 Blepharochalasis left upper eyelid: Secondary | ICD-10-CM | POA: Diagnosis not present

## 2018-06-09 DIAGNOSIS — D485 Neoplasm of uncertain behavior of skin: Secondary | ICD-10-CM | POA: Diagnosis not present

## 2018-06-09 DIAGNOSIS — H0235 Blepharochalasis left lower eyelid: Secondary | ICD-10-CM | POA: Diagnosis not present

## 2018-06-09 DIAGNOSIS — D210 Benign neoplasm of connective and other soft tissue of head, face and neck: Secondary | ICD-10-CM | POA: Diagnosis not present

## 2018-06-09 DIAGNOSIS — H53483 Generalized contraction of visual field, bilateral: Secondary | ICD-10-CM | POA: Diagnosis not present

## 2018-06-09 DIAGNOSIS — H57813 Brow ptosis, bilateral: Secondary | ICD-10-CM | POA: Diagnosis not present

## 2018-06-09 DIAGNOSIS — H53482 Generalized contraction of visual field, left eye: Secondary | ICD-10-CM | POA: Diagnosis not present

## 2018-06-09 DIAGNOSIS — H0270 Unspecified degenerative disorders of eyelid and periocular area: Secondary | ICD-10-CM | POA: Diagnosis not present

## 2018-09-17 DIAGNOSIS — E7800 Pure hypercholesterolemia, unspecified: Secondary | ICD-10-CM | POA: Diagnosis not present

## 2018-09-17 DIAGNOSIS — Z125 Encounter for screening for malignant neoplasm of prostate: Secondary | ICD-10-CM | POA: Diagnosis not present

## 2018-09-17 DIAGNOSIS — Z1159 Encounter for screening for other viral diseases: Secondary | ICD-10-CM | POA: Diagnosis not present

## 2018-09-17 DIAGNOSIS — E1165 Type 2 diabetes mellitus with hyperglycemia: Secondary | ICD-10-CM | POA: Diagnosis not present

## 2018-09-20 DIAGNOSIS — E1165 Type 2 diabetes mellitus with hyperglycemia: Secondary | ICD-10-CM | POA: Diagnosis not present

## 2018-09-20 DIAGNOSIS — Z0000 Encounter for general adult medical examination without abnormal findings: Secondary | ICD-10-CM | POA: Diagnosis not present

## 2018-09-20 DIAGNOSIS — E7800 Pure hypercholesterolemia, unspecified: Secondary | ICD-10-CM | POA: Diagnosis not present

## 2018-12-18 IMAGING — CR DG KNEE 1-2V*R*
2 series · 2 of 2 positions shown · non-contrast
Comparison: None.

CLINICAL DATA: Acute knee pain

EXAM:
RIGHT KNEE - 1-2 VIEW

[w knee ap right]
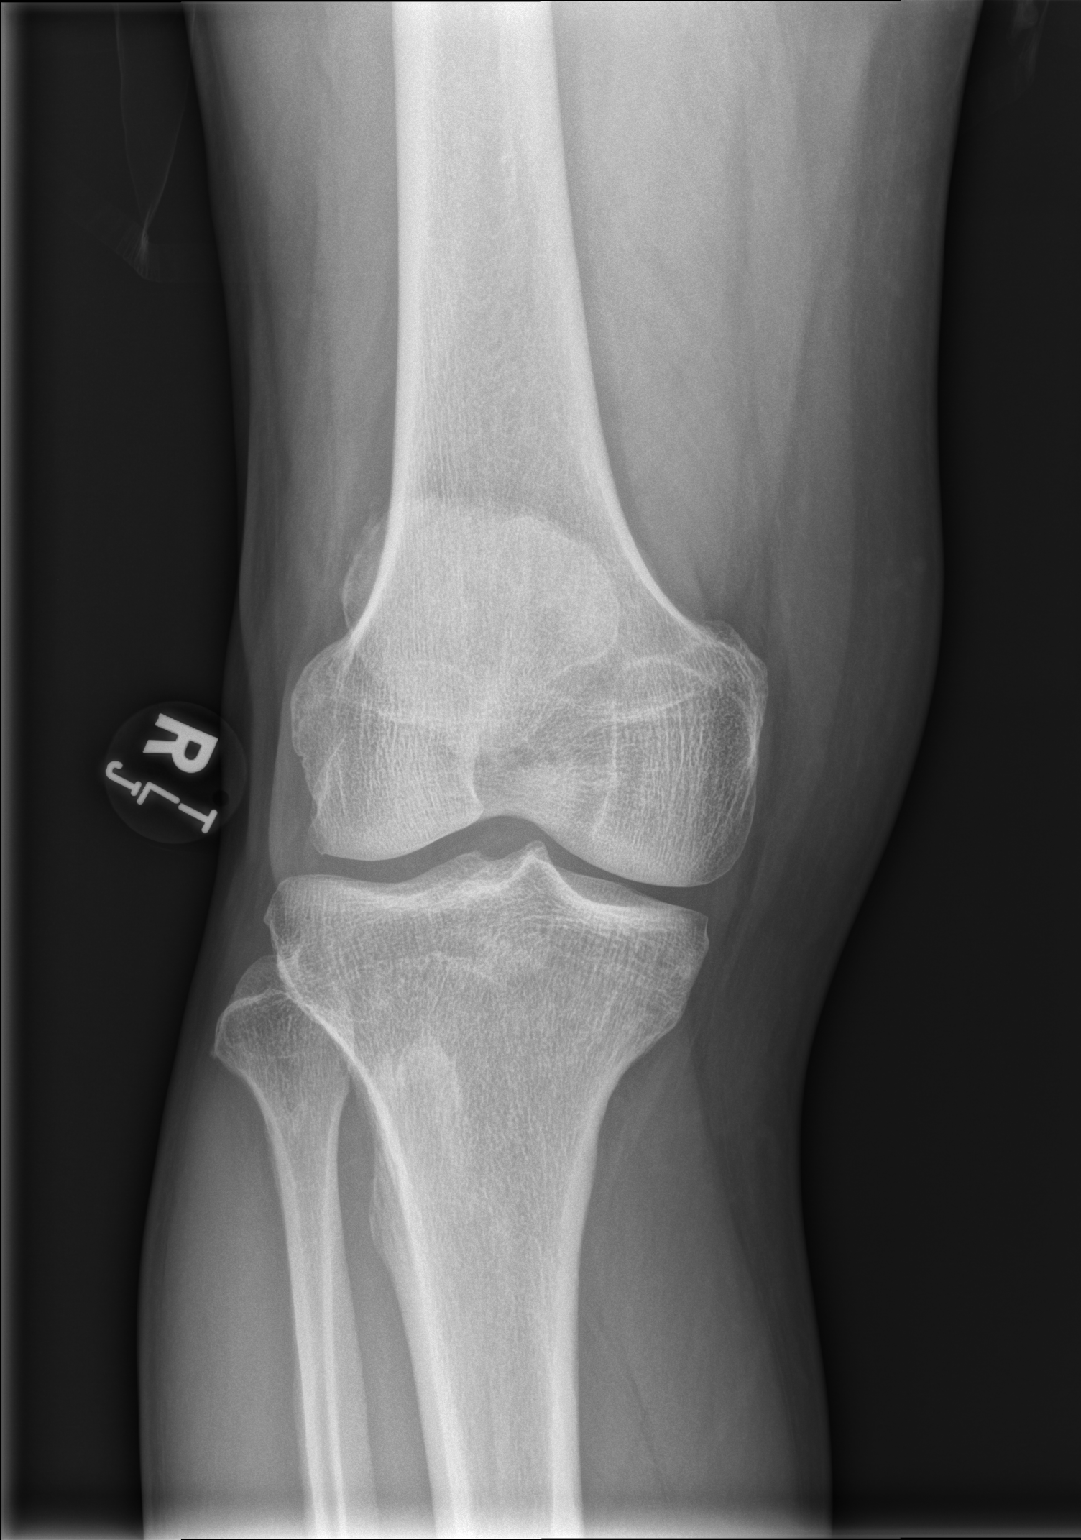

[w knee lat right]
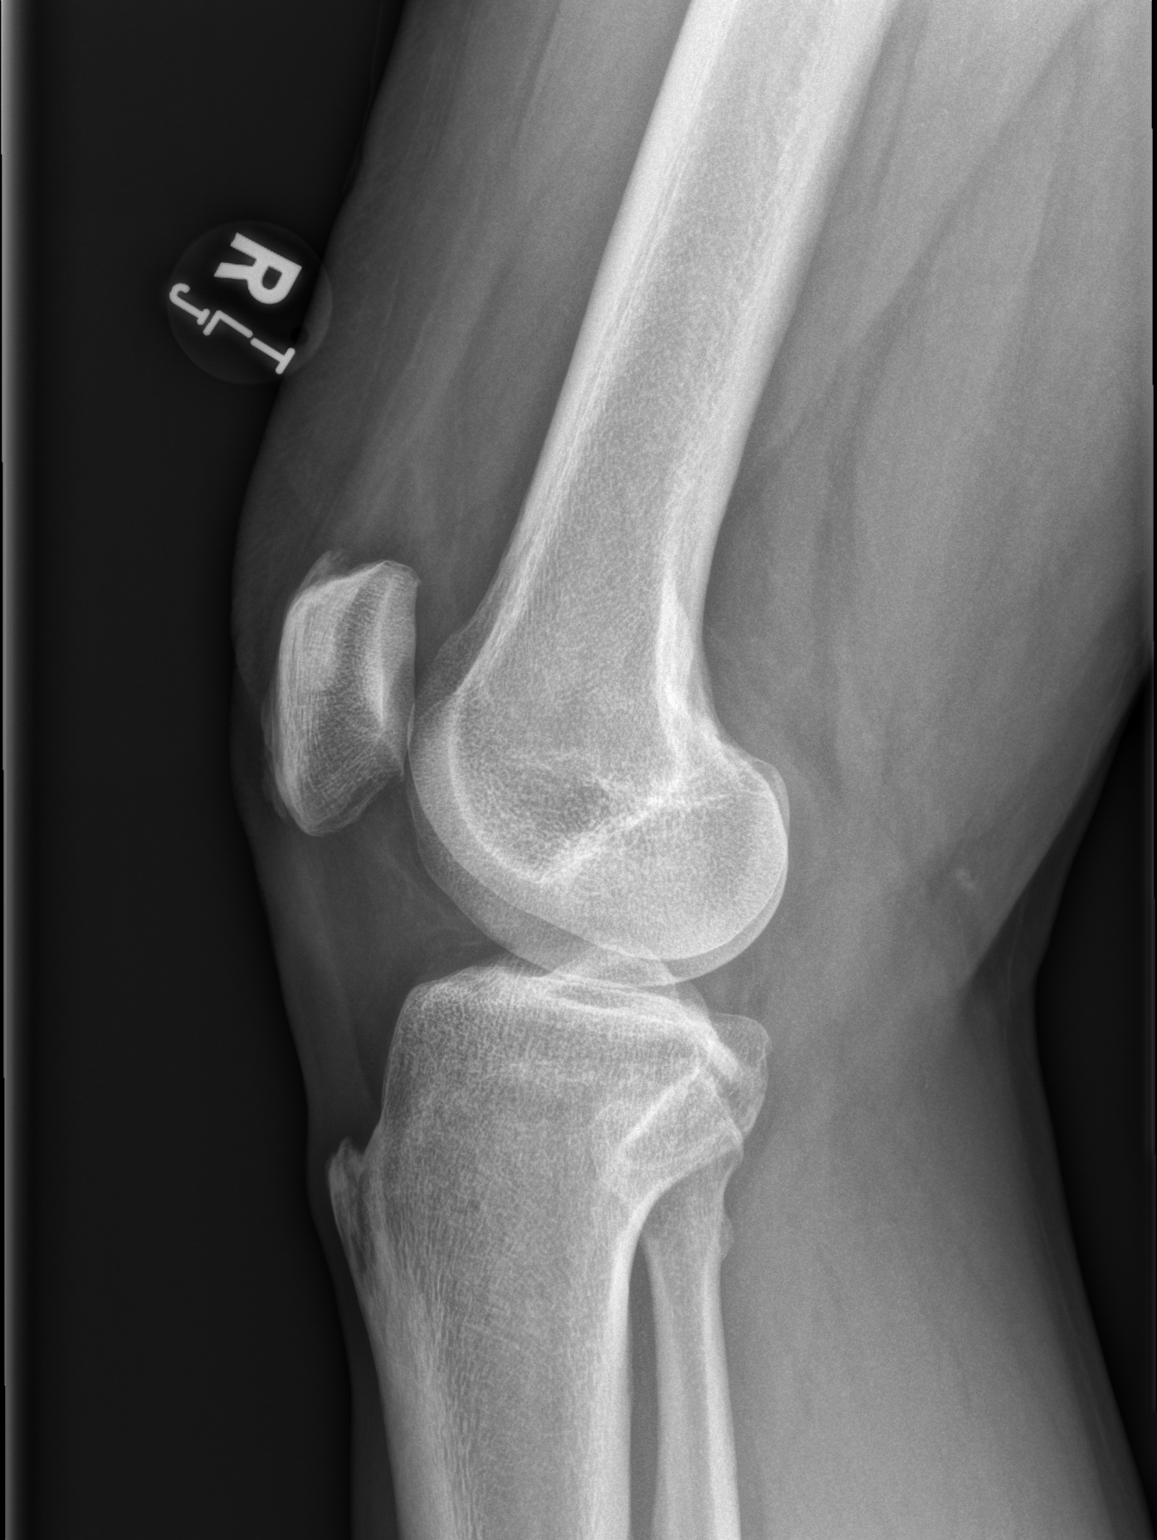

[2 of 2 positions shown; findings below may reference images not displayed]

FINDINGS: No acute fracture. No dislocation. Unremarkable soft tissues. Mild
spurring at the superior patella.
IMPRESSION: No acute bony pathology.

## 2019-10-26 DIAGNOSIS — L814 Other melanin hyperpigmentation: Secondary | ICD-10-CM | POA: Diagnosis not present

## 2019-10-26 DIAGNOSIS — L821 Other seborrheic keratosis: Secondary | ICD-10-CM | POA: Diagnosis not present

## 2019-10-26 DIAGNOSIS — D692 Other nonthrombocytopenic purpura: Secondary | ICD-10-CM | POA: Diagnosis not present

## 2019-10-26 DIAGNOSIS — D225 Melanocytic nevi of trunk: Secondary | ICD-10-CM | POA: Diagnosis not present

## 2019-10-26 DIAGNOSIS — D485 Neoplasm of uncertain behavior of skin: Secondary | ICD-10-CM | POA: Diagnosis not present

## 2019-10-26 DIAGNOSIS — D2261 Melanocytic nevi of right upper limb, including shoulder: Secondary | ICD-10-CM | POA: Diagnosis not present

## 2020-09-10 DIAGNOSIS — Z8546 Personal history of malignant neoplasm of prostate: Secondary | ICD-10-CM | POA: Diagnosis not present

## 2020-09-10 DIAGNOSIS — Z0000 Encounter for general adult medical examination without abnormal findings: Secondary | ICD-10-CM | POA: Diagnosis not present

## 2020-09-10 DIAGNOSIS — E7800 Pure hypercholesterolemia, unspecified: Secondary | ICD-10-CM | POA: Diagnosis not present

## 2020-09-10 DIAGNOSIS — Z125 Encounter for screening for malignant neoplasm of prostate: Secondary | ICD-10-CM | POA: Diagnosis not present

## 2020-09-10 DIAGNOSIS — E1165 Type 2 diabetes mellitus with hyperglycemia: Secondary | ICD-10-CM | POA: Diagnosis not present

## 2020-12-30 ENCOUNTER — Encounter: Payer: Self-pay | Admitting: Gastroenterology

## 2021-01-10 DIAGNOSIS — D23111 Other benign neoplasm of skin of right upper eyelid, including canthus: Secondary | ICD-10-CM | POA: Diagnosis not present

## 2021-01-10 DIAGNOSIS — H02831 Dermatochalasis of right upper eyelid: Secondary | ICD-10-CM | POA: Diagnosis not present

## 2021-01-10 DIAGNOSIS — H02132 Senile ectropion of right lower eyelid: Secondary | ICD-10-CM | POA: Diagnosis not present

## 2021-01-10 DIAGNOSIS — D485 Neoplasm of uncertain behavior of skin: Secondary | ICD-10-CM | POA: Diagnosis not present

## 2021-01-10 DIAGNOSIS — H02834 Dermatochalasis of left upper eyelid: Secondary | ICD-10-CM | POA: Diagnosis not present

## 2021-03-06 HISTORY — PX: BLEPHAROPLASTY: SUR158

## 2021-03-12 DIAGNOSIS — E1165 Type 2 diabetes mellitus with hyperglycemia: Secondary | ICD-10-CM | POA: Diagnosis not present

## 2021-03-12 DIAGNOSIS — E7800 Pure hypercholesterolemia, unspecified: Secondary | ICD-10-CM | POA: Diagnosis not present

## 2021-03-12 DIAGNOSIS — Z1211 Encounter for screening for malignant neoplasm of colon: Secondary | ICD-10-CM | POA: Diagnosis not present

## 2021-06-06 ENCOUNTER — Encounter: Payer: Self-pay | Admitting: Gastroenterology

## 2021-07-11 ENCOUNTER — Other Ambulatory Visit: Payer: Self-pay

## 2021-07-11 ENCOUNTER — Ambulatory Visit (AMBULATORY_SURGERY_CENTER): Payer: PPO | Admitting: *Deleted

## 2021-07-11 VITALS — Ht 68.0 in | Wt 210.0 lb

## 2021-07-11 DIAGNOSIS — Z86010 Personal history of colonic polyps: Secondary | ICD-10-CM

## 2021-07-11 MED ORDER — PEG 3350-KCL-NA BICARB-NACL 420 G PO SOLR: 4000.0000 mL | Freq: Once | ORAL | 0 refills | Status: AC

## 2021-07-11 NOTE — Progress Notes (Signed)

## 2021-08-01 ENCOUNTER — Encounter: Payer: PPO | Admitting: Gastroenterology

## 2022-01-22 ENCOUNTER — Other Ambulatory Visit: Payer: Self-pay

## 2022-01-22 ENCOUNTER — Ambulatory Visit: Payer: PPO | Attending: Neurosurgery

## 2022-01-22 DIAGNOSIS — M5459 Other low back pain: Secondary | ICD-10-CM | POA: Diagnosis not present

## 2022-01-22 NOTE — Therapy (Signed)
OUTPATIENT PHYSICAL THERAPY THORACOLUMBAR EVALUATION   Patient Name: Jimmy Oconnor MRN: [id number] DOB:12-26-1945, 76 y.o., male Today's Date: 01/22/2022   PT End of Session - 01/22/22 1023     Visit Number 1    Number of Visits 8    Date for PT Re-Evaluation 03/28/22    PT Start Time 8242    PT Stop Time 1110    PT Time Calculation (min) 38 min    Activity Tolerance Patient tolerated treatment well    Behavior During Therapy [facility] for tasks assessed/performed             Past Medical History:  Diagnosis Date   Arm fracture childhood   left   Cancer ([place]) 2005   prostate   Diabetes mellitus without complication ([place])    Hyperlipidemia    Hypertension    Sleep apnea    mild case per pt   Past Surgical History:  Procedure Laterality Date   COLONOSCOPY  2007   EXCISION MASS LOWER EXTREMETIES Left 05/29/2016   Procedure: EXCISION LEFT THIGH MASS;  Surgeon: Erroll Luna, MD;  Location: [place];  Service: General;  Laterality: Left;   HAND SURGERY  2000   POLYPECTOMY     PROSTATECTOMY  2005   TONSILLECTOMY     VASECTOMY     VASECTOMY REVERSAL     Patient Active Problem List   Diagnosis Date Noted   DIVERTICULOSIS-COLON 05/02/2010   DIARRHEA 05/02/2010    PCP: Aletha Halim., PA-C  REFERRING PROVIDER: Vallarie Mare, MD  REFERRING DIAG: Spinal stenosis, lumbar region with neurogenic claudication  Rationale for Evaluation and Treatment Rehabilitation  THERAPY DIAG:  Other low back pain  ONSET DATE: 10/29/21  SUBJECTIVE:                                                                                                                                                                                           SUBJECTIVE STATEMENT: Patient reports that he began having back pain around October 29, 2021. However, he notes that it has gotten significantly better in the last 3-4 days. He is still having pain radiate into his low back and hips, but it is not as  severe as it was previously. He in unable to recall any MOI to cause this low back pain. He has had pain like this before, but it was not as severe. He also noticed that his legs are still weak. He has also begun to have cramps in his legs since his back pain began.   PERTINENT HISTORY:  Left knee pain, history of cancer, HTN, and DM   PAIN:  Are you having pain? Yes: NPRS scale: 3/10 Pain location: low back and both hips Pain description: throbbing, numbness, shooting  Aggravating factors: getting up from his couch, standing (short periods), walking ("a couple hundred yards") Relieving factors: sitting on a hard chair,   PRECAUTIONS: None  WEIGHT BEARING RESTRICTIONS: No  FALLS:  Has patient fallen in last 6 months? No  LIVING ENVIRONMENT: Lives with: lives with their spouse Lives in: House/apartment Stairs: Yes: Internal: "to basement"  steps; can reach both and External: "a couple" steps; on left going up; with step to pattern Has following equipment at home: Single point cane  OCCUPATION: retired  PLOF: Independent  PATIENT GOALS: reduced pain, improved mobility, improved strength   OBJECTIVE:  PATIENT SURVEYS:  FOTO 39.78  SCREENING FOR RED FLAGS: Bowel or bladder incontinence: No Spinal tumors: No Cauda equina syndrome: No Compression fracture: No Abdominal aneurysm: No  COGNITION: Overall cognitive status: Within functional limits for tasks assessed     SENSATION: Patient reports that he has numbness into the calf (L>R)   No deficits to light touch noted  POSTURE: rounded shoulders, forward head, and flexed trunk   PALPATION: No reported tenderness to palpation  LUMBAR ROM:   AROM eval  Flexion 54  Extension 22  Right lateral flexion 75% limited; discomfort   Left lateral flexion 75% limited; discomfort  Right rotation 25% limited; aggravated low back and left calf pain (unsure if it was left or right rotation)   Left rotation 25% limited;  aggravated low back and left calf pain (unsure if it was left or right rotation)    (Blank rows = not tested)  LOWER EXTREMITY ROM: WFL for activities assessed     LOWER EXTREMITY MMT:    MMT Right eval Left eval  Hip flexion 4+/5 4+/  Hip extension    Hip abduction    Hip adduction    Hip internal rotation    Hip external rotation    Knee flexion 4+/5 4/5  Knee extension 4/5 4+/5  Ankle dorsiflexion 4/5 4/5  Ankle plantarflexion    Ankle inversion    Ankle eversion     (Blank rows = not tested)  LUMBAR SPECIAL TESTS:  Straight leg raise test: Negative  GAIT: Assistive device utilized: None Level of assistance: Complete Independence   TODAY'S TREATMENT:                                                                                                                              DATE:     PATIENT EDUCATION:  Education details: POC, healing, goals for therapy Person educated: Patient Education method: Explanation Education comprehension: verbalized understanding  HOME EXERCISE PROGRAM:   ASSESSMENT:  CLINICAL IMPRESSION: Patient is a 76 y.o. male who was seen today for physical therapy evaluation and treatment for low back pain with neurogenic claudication. He presented with moderate pain severity and irritability with lumbar AROM being the most aggravating to his familiar  symptoms. Recommend that he continue with skilled physical therapy to address his remaining impairments to return to his prior level of function.   OBJECTIVE IMPAIRMENTS: Abnormal gait, decreased activity tolerance, decreased mobility, difficulty walking, decreased ROM, decreased strength, hypomobility, postural dysfunction, and pain.   ACTIVITY LIMITATIONS: lifting, bending, stairs, transfers, and locomotion level  PARTICIPATION LIMITATIONS: community activity and yard work  PERSONAL FACTORS: 3+ comorbidities: history of cancer, HTN, and DM   are also affecting patient's functional outcome.    REHAB POTENTIAL: Good  CLINICAL DECISION MAKING: Evolving/moderate complexity  EVALUATION COMPLEXITY: Moderate   GOALS: Goals reviewed with patient? Yes LONG TERM GOALS: Target date: 02/19/2022  Patient will be independent with his HEP.  Baseline:  Goal status: INITIAL  2.  Patient will be able to navigate at least 4 steps with a reciprocal pattern for improved household mobility.  Baseline:  Goal status: INITIAL  3.  Patient will be able to complete his daily activities without his familiar pain exceeding 1/10.  Baseline:  Goal status: INITIAL  4.  Patient will be able to walk at least 15 minutes without being limited by his familiar low back pain.  Baseline:  Goal status: INITIAL  PLAN:  PT FREQUENCY: 2x/week  PT DURATION: 4 weeks  PLANNED INTERVENTIONS: Therapeutic exercises, Therapeutic activity, Neuromuscular re-education, Balance training, Patient/Family education, Self Care, Joint mobilization, Stair training, Electrical stimulation, Spinal mobilization, Cryotherapy, Moist heat, Manual therapy, and Re-evaluation.  PLAN FOR NEXT SESSION: nustep, lumbar and lower extremity strengthening, and modalities as needed    Darlin Coco, PT 01/22/2022, 4:06 PM

## 2022-01-28 ENCOUNTER — Ambulatory Visit: Payer: PPO

## 2022-01-28 DIAGNOSIS — M5459 Other low back pain: Secondary | ICD-10-CM

## 2022-01-28 NOTE — Therapy (Signed)
OUTPATIENT PHYSICAL THERAPY THORACOLUMBAR TREATMENT   Patient Name: Jimmy Oconnor MRN: [id number] DOB:10-28-1945, 76 y.o., male Today's Date: 01/28/2022   PT End of Session - 01/28/22 1350     Visit Number 2    Number of Visits 8    Date for PT Re-Evaluation 03/28/22    PT Start Time 6237    PT Stop Time 1430    PT Time Calculation (min) 43 min    Activity Tolerance Patient tolerated treatment well    Behavior During Therapy [facility] for tasks assessed/performed              Past Medical History:  Diagnosis Date   Arm fracture childhood   left   Cancer ([place]) 2005   prostate   Diabetes mellitus without complication ([place])    Hyperlipidemia    Hypertension    Sleep apnea    mild case per pt   Past Surgical History:  Procedure Laterality Date   COLONOSCOPY  2007   EXCISION MASS LOWER EXTREMETIES Left 05/29/2016   Procedure: EXCISION LEFT THIGH MASS;  Surgeon: Erroll Luna, MD;  Location: [place];  Service: General;  Laterality: Left;   HAND SURGERY  2000   POLYPECTOMY     PROSTATECTOMY  2005   TONSILLECTOMY     VASECTOMY     VASECTOMY REVERSAL     Patient Active Problem List   Diagnosis Date Noted   DIVERTICULOSIS-COLON 05/02/2010   DIARRHEA 05/02/2010    PCP: Aletha Halim., PA-C  REFERRING PROVIDER: Vallarie Mare, MD  REFERRING DIAG: Spinal stenosis, lumbar region with neurogenic claudication  Rationale for Evaluation and Treatment Rehabilitation  THERAPY DIAG:  Other low back pain  ONSET DATE: 10/29/21  SUBJECTIVE:                                                                                                                                                                                           SUBJECTIVE STATEMENT: He reports that he feels about the same as he did at his last appointment. He was told that he has "two rather large cysts" and they are going to try to have them removed.   PERTINENT HISTORY:  Left knee pain, history of  cancer, HTN, and DM   PAIN:  Are you having pain? Yes: NPRS scale: 1/10 Pain location: low back and both hips Pain description: throbbing, numbness, shooting  Aggravating factors: getting up from his couch, standing (short periods), walking ("a couple hundred yards") Relieving factors: sitting on a hard chair,   PRECAUTIONS: None  WEIGHT BEARING RESTRICTIONS: No  FALLS:  Has patient fallen in last 6  months? No  LIVING ENVIRONMENT: Lives with: lives with their spouse Lives in: House/apartment Stairs: Yes: Internal: "to basement"  steps; can reach both and External: "a couple" steps; on left going up; with step to pattern Has following equipment at home: Single point cane  OCCUPATION: retired  PLOF: Independent  PATIENT GOALS: reduced pain, improved mobility, improved strength   OBJECTIVE:  PATIENT SURVEYS:  FOTO 39.78  SCREENING FOR RED FLAGS: Bowel or bladder incontinence: No Spinal tumors: No Cauda equina syndrome: No Compression fracture: No Abdominal aneurysm: No  COGNITION: Overall cognitive status: Within functional limits for tasks assessed     SENSATION: Patient reports that he has numbness into the calf (L>R)   No deficits to light touch noted  POSTURE: rounded shoulders, forward head, and flexed trunk   PALPATION: No reported tenderness to palpation  LUMBAR ROM:   AROM eval  Flexion 54  Extension 22  Right lateral flexion 75% limited; discomfort   Left lateral flexion 75% limited; discomfort  Right rotation 25% limited; aggravated low back and left calf pain (unsure if it was left or right rotation)   Left rotation 25% limited; aggravated low back and left calf pain (unsure if it was left or right rotation)    (Blank rows = not tested)  LOWER EXTREMITY ROM: WFL for activities assessed     LOWER EXTREMITY MMT:    MMT Right eval Left eval  Hip flexion 4+/5 4+/  Hip extension    Hip abduction    Hip adduction    Hip internal rotation     Hip external rotation    Knee flexion 4+/5 4/5  Knee extension 4/5 4+/5  Ankle dorsiflexion 4/5 4/5  Ankle plantarflexion    Ankle inversion    Ankle eversion     (Blank rows = not tested)  LUMBAR SPECIAL TESTS:  Straight leg raise test: Negative  GAIT: Assistive device utilized: None Level of assistance: Complete Independence   TODAY'S TREATMENT:                                                                                                                              DATE:                                     10/31 EXERCISE LOG  Exercise Repetitions and Resistance Comments  Nustep L4 x 15 minutes   Resisted pull downs  Blue XTS x 2 minutes   Ball roll out  2 minutes   Cybex knee flexion 30# x 3 minutes   Cybex knee extension  10# x 2 minutes    Rocker board 4.5 minutes   Side stepping 2 minutes     Blank cell = exercise not performed today   PATIENT EDUCATION:  Education details: healing, strengthening, gait mechanics Person educated: Patient Education method: Explanation Education comprehension: verbalized understanding  HOME  EXERCISE PROGRAM:   ASSESSMENT:  CLINICAL IMPRESSION: Patient was introduced to multiple new interventions for improved lumbar and lower extremity strength needed for improved function with his daily activities. He required minimal cueing with today's new interventions for proper biomechanics to facilitate proper musculature engagement. He experienced no back pain or discomfort with any of today's interventions. He reported that his legs were tired upon the conclusion of treatment. He continues to require skilled physical therapy to address his remaining impairments to maximize his functional mobility.   OBJECTIVE IMPAIRMENTS: Abnormal gait, decreased activity tolerance, decreased mobility, difficulty walking, decreased ROM, decreased strength, hypomobility, postural dysfunction, and pain.   ACTIVITY LIMITATIONS: lifting, bending, stairs,  transfers, and locomotion level  PARTICIPATION LIMITATIONS: community activity and yard work  PERSONAL FACTORS: 3+ comorbidities: history of cancer, HTN, and DM   are also affecting patient's functional outcome.   REHAB POTENTIAL: Good  CLINICAL DECISION MAKING: Evolving/moderate complexity  EVALUATION COMPLEXITY: Moderate   GOALS: Goals reviewed with patient? Yes LONG TERM GOALS: Target date: 02/19/2022  Patient will be independent with his HEP.  Baseline:  Goal status: INITIAL  2.  Patient will be able to navigate at least 4 steps with a reciprocal pattern for improved household mobility.  Baseline:  Goal status: INITIAL  3.  Patient will be able to complete his daily activities without his familiar pain exceeding 1/10.  Baseline:  Goal status: INITIAL  4.  Patient will be able to walk at least 15 minutes without being limited by his familiar low back pain.  Baseline:  Goal status: INITIAL  PLAN:  PT FREQUENCY: 2x/week  PT DURATION: 4 weeks  PLANNED INTERVENTIONS: Therapeutic exercises, Therapeutic activity, Neuromuscular re-education, Balance training, Patient/Family education, Self Care, Joint mobilization, Stair training, Electrical stimulation, Spinal mobilization, Cryotherapy, Moist heat, Manual therapy, and Re-evaluation.  PLAN FOR NEXT SESSION: nustep, lumbar and lower extremity strengthening, and modalities as needed    Darlin Coco, PT 01/28/2022, 2:41 PM

## 2022-01-29 ENCOUNTER — Other Ambulatory Visit: Payer: Self-pay | Admitting: Neurosurgery

## 2022-01-30 ENCOUNTER — Ambulatory Visit: Payer: PPO | Attending: Neurosurgery

## 2022-01-30 DIAGNOSIS — M5459 Other low back pain: Secondary | ICD-10-CM | POA: Diagnosis not present

## 2022-01-30 NOTE — Therapy (Signed)
OUTPATIENT PHYSICAL THERAPY THORACOLUMBAR TREATMENT   Patient Name: Jimmy Oconnor MRN: [id number] DOB:[birth year], 76 y.o., male Today's Date: 01/30/2022   PT End of Session - 01/30/22 1352     Visit Number 3    Number of Visits 8    Date for PT Re-Evaluation 03/28/22    PT Start Time 1346    PT Stop Time 1430    PT Time Calculation (min) 44 min    Activity Tolerance Patient tolerated treatment well    Behavior During Therapy [facility] for tasks assessed/performed               Past Medical History:  Diagnosis Date   Arm fracture childhood   left   Cancer ([place]) 2005   prostate   Diabetes mellitus without complication ([place])    Hyperlipidemia    Hypertension    Sleep apnea    mild case per pt   Past Surgical History:  Procedure Laterality Date   COLONOSCOPY  2007   EXCISION MASS LOWER EXTREMETIES Left 05/29/2016   Procedure: EXCISION LEFT THIGH MASS;  Surgeon: Erroll Luna, MD;  Location: [place];  Service: General;  Laterality: Left;   HAND SURGERY  2000   POLYPECTOMY     PROSTATECTOMY  2005   TONSILLECTOMY     VASECTOMY     VASECTOMY REVERSAL     Patient Active Problem List   Diagnosis Date Noted   DIVERTICULOSIS-COLON 05/02/2010   DIARRHEA 05/02/2010    PCP: Aletha Halim., PA-C  REFERRING PROVIDER: Vallarie Mare, MD  REFERRING DIAG: Spinal stenosis, lumbar region with neurogenic claudication  Rationale for Evaluation and Treatment Rehabilitation  THERAPY DIAG:  Other low back pain  ONSET DATE: 10/29/21  SUBJECTIVE:                                                                                                                                                                                           SUBJECTIVE STATEMENT: Patient reports that he was a little sore after his last appointment. However, he feels alright today. He notes that he is scheduled to get the cysts removed out of his back by 02/11/22.  PERTINENT HISTORY:  Left knee pain,  history of cancer, HTN, and DM   PAIN:  Are you having pain? Yes: NPRS scale: <1/10 Pain location: low back and both hips Pain description: throbbing, numbness, shooting  Aggravating factors: getting up from his couch, standing (short periods), walking ("a couple hundred yards") Relieving factors: sitting on a hard chair,   PRECAUTIONS: None  WEIGHT BEARING RESTRICTIONS: No  FALLS:  Has patient fallen in last 6 months?  No  LIVING ENVIRONMENT: Lives with: lives with their spouse Lives in: House/apartment Stairs: Yes: Internal: "to basement"  steps; can reach both and External: "a couple" steps; on left going up; with step to pattern Has following equipment at home: Single point cane  OCCUPATION: retired  PLOF: Independent  PATIENT GOALS: reduced pain, improved mobility, improved strength   OBJECTIVE:  PATIENT SURVEYS:  FOTO 39.78  SCREENING FOR RED FLAGS: Bowel or bladder incontinence: No Spinal tumors: No Cauda equina syndrome: No Compression fracture: No Abdominal aneurysm: No  COGNITION: Overall cognitive status: Within functional limits for tasks assessed     SENSATION: Patient reports that he has numbness into the calf (L>R)   No deficits to light touch noted  POSTURE: rounded shoulders, forward head, and flexed trunk   PALPATION: No reported tenderness to palpation  LUMBAR ROM:   AROM eval  Flexion 54  Extension 22  Right lateral flexion 75% limited; discomfort   Left lateral flexion 75% limited; discomfort  Right rotation 25% limited; aggravated low back and left calf pain (unsure if it was left or right rotation)   Left rotation 25% limited; aggravated low back and left calf pain (unsure if it was left or right rotation)    (Blank rows = not tested)  LOWER EXTREMITY ROM: WFL for activities assessed     LOWER EXTREMITY MMT:    MMT Right eval Left eval  Hip flexion 4+/5 4+/  Hip extension    Hip abduction    Hip adduction    Hip internal  rotation    Hip external rotation    Knee flexion 4+/5 4/5  Knee extension 4/5 4+/5  Ankle dorsiflexion 4/5 4/5  Ankle plantarflexion    Ankle inversion    Ankle eversion     (Blank rows = not tested)  LUMBAR SPECIAL TESTS:  Straight leg raise test: Negative  GAIT: Assistive device utilized: None Level of assistance: Complete Independence   TODAY'S TREATMENT:                                                                                                                              DATE:                                     11/2 EXERCISE LOG  Exercise Repetitions and Resistance Comments  Nustep  L5 x 15 minutes   Lateral step up and over  6" step x 2 minutes    Wood chops  Orange XTS x 2 minutes each   Cybex lumbar extension  80# x 2 minutes    Cybex knee flexion  40# x 3 minutes   Cybex knee extension  10# x 3 minutes    Rocker board  4 minutes     Blank cell = exercise not performed today  10/31 EXERCISE LOG  Exercise Repetitions and Resistance Comments  Nustep L4 x 15 minutes   Resisted pull downs  Blue XTS x 2 minutes   Ball roll out  2 minutes   Cybex knee flexion 30# x 3 minutes   Cybex knee extension  10# x 2 minutes    Rocker board 4.5 minutes   Side stepping 2 minutes     Blank cell = exercise not performed today   PATIENT EDUCATION:  Education details: healing, strengthening, Aeronautical engineer Person educated: Patient Education method: Explanation Education comprehension: verbalized understanding  HOME EXERCISE PROGRAM:   ASSESSMENT:  CLINICAL IMPRESSION: Patient was introduced to multiple new interventions for improved lumbar strength and stability needed for improved function with his daily activities. He required minimal cueing with wood chops for proper exercise performance to facilitate lumbar rotation. He experienced no significant pain or discomfort with any of today's interventions. He reported feeling a little more  tired than his last appointment upon the conclusion of treatment. He continues to require skilled physical therapy to address his remaining impairments to maximize his functional mobility.   OBJECTIVE IMPAIRMENTS: Abnormal gait, decreased activity tolerance, decreased mobility, difficulty walking, decreased ROM, decreased strength, hypomobility, postural dysfunction, and pain.   ACTIVITY LIMITATIONS: lifting, bending, stairs, transfers, and locomotion level  PARTICIPATION LIMITATIONS: community activity and yard work  PERSONAL FACTORS: 3+ comorbidities: history of cancer, HTN, and DM   are also affecting patient's functional outcome.   REHAB POTENTIAL: Good  CLINICAL DECISION MAKING: Evolving/moderate complexity  EVALUATION COMPLEXITY: Moderate   GOALS: Goals reviewed with patient? Yes LONG TERM GOALS: Target date: 02/19/2022  Patient will be independent with his HEP.  Baseline:  Goal status: INITIAL  2.  Patient will be able to navigate at least 4 steps with a reciprocal pattern for improved household mobility.  Baseline:  Goal status: INITIAL  3.  Patient will be able to complete his daily activities without his familiar pain exceeding 1/10.  Baseline:  Goal status: INITIAL  4.  Patient will be able to walk at least 15 minutes without being limited by his familiar low back pain.  Baseline:  Goal status: INITIAL  PLAN:  PT FREQUENCY: 2x/week  PT DURATION: 4 weeks  PLANNED INTERVENTIONS: Therapeutic exercises, Therapeutic activity, Neuromuscular re-education, Balance training, Patient/Family education, Self Care, Joint mobilization, Stair training, Electrical stimulation, Spinal mobilization, Cryotherapy, Moist heat, Manual therapy, and Re-evaluation.  PLAN FOR NEXT SESSION: nustep, lumbar and lower extremity strengthening, and modalities as needed    Darlin Coco, PT 01/30/2022, 3:10 PM

## 2022-02-04 ENCOUNTER — Ambulatory Visit: Payer: PPO

## 2022-02-04 DIAGNOSIS — M5459 Other low back pain: Secondary | ICD-10-CM | POA: Diagnosis not present

## 2022-02-04 NOTE — Therapy (Signed)
OUTPATIENT PHYSICAL THERAPY THORACOLUMBAR TREATMENT   Patient Name: Jimmy Oconnor MRN: [id number] DOB:12-27-1945, 76 y.o., male Today's Date: 02/04/2022   PT End of Session - 02/04/22 1343     Visit Number 4    Number of Visits 8    Date for PT Re-Evaluation 03/28/22    PT Start Time 4627    PT Stop Time 1427    PT Time Calculation (min) 46 min    Activity Tolerance Patient tolerated treatment well    Behavior During Therapy [facility] for tasks assessed/performed               Past Medical History:  Diagnosis Date   Arm fracture childhood   left   Cancer ([place]) 2005   prostate   Diabetes mellitus without complication ([place])    Hyperlipidemia    Hypertension    Sleep apnea    mild case per pt   Past Surgical History:  Procedure Laterality Date   COLONOSCOPY  2007   EXCISION MASS LOWER EXTREMETIES Left 05/29/2016   Procedure: EXCISION LEFT THIGH MASS;  Surgeon: Erroll Luna, MD;  Location: [place];  Service: General;  Laterality: Left;   HAND SURGERY  2000   POLYPECTOMY     PROSTATECTOMY  2005   TONSILLECTOMY     VASECTOMY     VASECTOMY REVERSAL     Patient Active Problem List   Diagnosis Date Noted   DIVERTICULOSIS-COLON 05/02/2010   DIARRHEA 05/02/2010    PCP: Aletha Halim., PA-C  REFERRING PROVIDER: Vallarie Mare, MD  REFERRING DIAG: Spinal stenosis, lumbar region with neurogenic claudication  Rationale for Evaluation and Treatment Rehabilitation  THERAPY DIAG:  Other low back pain  ONSET DATE: 10/29/21  SUBJECTIVE:                                                                                                                                                                                           SUBJECTIVE STATEMENT: Patient reports that he feels about the same. He has not been doing any exercises at home.   PERTINENT HISTORY:  Left knee pain, history of cancer, HTN, and DM   PAIN:  Are you having pain? Yes: NPRS scale: <1/10 Pain  location: low back and both hips Pain description: throbbing, numbness, shooting  Aggravating factors: getting up from his couch, standing (short periods), walking ("a couple hundred yards") Relieving factors: sitting on a hard chair,   PRECAUTIONS: None  WEIGHT BEARING RESTRICTIONS: No  FALLS:  Has patient fallen in last 6 months? No  LIVING ENVIRONMENT: Lives with: lives with their spouse Lives in: House/apartment Stairs: Yes: Internal: "  to basement"  steps; can reach both and External: "a couple" steps; on left going up; with step to pattern Has following equipment at home: Single point cane  OCCUPATION: retired  PLOF: Independent  PATIENT GOALS: reduced pain, improved mobility, improved strength   OBJECTIVE:  PATIENT SURVEYS:  FOTO 39.78  SCREENING FOR RED FLAGS: Bowel or bladder incontinence: No Spinal tumors: No Cauda equina syndrome: No Compression fracture: No Abdominal aneurysm: No  COGNITION: Overall cognitive status: Within functional limits for tasks assessed     SENSATION: Patient reports that he has numbness into the calf (L>R)   No deficits to light touch noted  POSTURE: rounded shoulders, forward head, and flexed trunk   PALPATION: No reported tenderness to palpation  LUMBAR ROM:   AROM eval  Flexion 54  Extension 22  Right lateral flexion 75% limited; discomfort   Left lateral flexion 75% limited; discomfort  Right rotation 25% limited; aggravated low back and left calf pain (unsure if it was left or right rotation)   Left rotation 25% limited; aggravated low back and left calf pain (unsure if it was left or right rotation)    (Blank rows = not tested)  LOWER EXTREMITY ROM: WFL for activities assessed     LOWER EXTREMITY MMT:    MMT Right eval Left eval  Hip flexion 4+/5 4+/  Hip extension    Hip abduction    Hip adduction    Hip internal rotation    Hip external rotation    Knee flexion 4+/5 4/5  Knee extension 4/5 4+/5  Ankle  dorsiflexion 4/5 4/5  Ankle plantarflexion    Ankle inversion    Ankle eversion     (Blank rows = not tested)  LUMBAR SPECIAL TESTS:  Straight leg raise test: Negative  GAIT: Assistive device utilized: None Level of assistance: Complete Independence   TODAY'S TREATMENT:                                                                                                                              DATE:                                    11/7  EXERCISE LOG  Exercise Repetitions and Resistance Comments  Nustep  L4 x 15 minutes   Side stepping  Green t-band x 2 minutes   Standing hip extension  Green t-band x 2 minutes   Wood chops  Blue t-band x 1 minute each   Eccentric heel tap  6' step x 20 reps each   Standing heel/toe raises  3 minutes     Blank cell = exercise not performed today                                    11/2 EXERCISE LOG  Exercise Repetitions and Resistance Comments  Nustep  L5 x 15 minutes   Lateral step up and over  6" step x 2 minutes    Wood chops  Orange XTS x 2 minutes each   Cybex lumbar extension  80# x 2 minutes    Cybex knee flexion  40# x 3 minutes   Cybex knee extension  10# x 3 minutes    Rocker board  4 minutes     Blank cell = exercise not performed today                                   10/31 EXERCISE LOG  Exercise Repetitions and Resistance Comments  Nustep L4 x 15 minutes   Resisted pull downs  Blue XTS x 2 minutes   Ball roll out  2 minutes   Cybex knee flexion 30# x 3 minutes   Cybex knee extension  10# x 2 minutes    Rocker board 4.5 minutes   Side stepping 2 minutes     Blank cell = exercise not performed today   PATIENT EDUCATION:  Education details: benefits of exercise, HEP  Person educated: Patient Education method: Explanation Education comprehension: verbalized understanding  HOME EXERCISE PROGRAM: [id number]  ASSESSMENT:  CLINICAL IMPRESSION: Treatment focused on familiar interventions to update his HEP. He required  minimal cueing with today's interventions for proper exercise performance. His updated HEP was reviewed and he reported feeling comfortable with these interventions. He reported feeling alright upon the conclusion of treatment. He will be discharged at his next appointment with a HEP due to his upcoming lumbar procedure.   OBJECTIVE IMPAIRMENTS: Abnormal gait, decreased activity tolerance, decreased mobility, difficulty walking, decreased ROM, decreased strength, hypomobility, postural dysfunction, and pain.   ACTIVITY LIMITATIONS: lifting, bending, stairs, transfers, and locomotion level  PARTICIPATION LIMITATIONS: community activity and yard work  PERSONAL FACTORS: 3+ comorbidities: history of cancer, HTN, and DM   are also affecting patient's functional outcome.   REHAB POTENTIAL: Good  CLINICAL DECISION MAKING: Evolving/moderate complexity  EVALUATION COMPLEXITY: Moderate   GOALS: Goals reviewed with patient? Yes LONG TERM GOALS: Target date: 02/19/2022  Patient will be independent with his HEP.  Baseline:  Goal status: INITIAL  2.  Patient will be able to navigate at least 4 steps with a reciprocal pattern for improved household mobility.  Baseline:  Goal status: INITIAL  3.  Patient will be able to complete his daily activities without his familiar pain exceeding 1/10.  Baseline:  Goal status: INITIAL  4.  Patient will be able to walk at least 15 minutes without being limited by his familiar low back pain.  Baseline:  Goal status: INITIAL  PLAN:  PT FREQUENCY: 2x/week  PT DURATION: 4 weeks  PLANNED INTERVENTIONS: Therapeutic exercises, Therapeutic activity, Neuromuscular re-education, Balance training, Patient/Family education, Self Care, Joint mobilization, Stair training, Electrical stimulation, Spinal mobilization, Cryotherapy, Moist heat, Manual therapy, and Re-evaluation.  PLAN FOR NEXT SESSION: nustep, lumbar and lower extremity strengthening, and modalities  as needed    Darlin Coco, PT 02/04/2022, 3:30 PM

## 2022-02-04 NOTE — Pre-Procedure Instructions (Signed)
Surgical Instructions    Your procedure is scheduled on February 11, 2022.  Report to [facility] Main Entrance "A" at 5:30 A.M., then check in with the Admitting office.  Call this number if you have problems the morning of surgery:  [phone]   If you have any questions prior to your surgery date call [phone]: Open Monday-Friday 8am-4pm    Remember:  Do not eat or drink after midnight the night before your surgery     Take these medicines the morning of surgery with A SIP OF WATER;  NONE  As of today, STOP taking any Aspirin (unless otherwise instructed by your surgeon) Aleve, Naproxen, Ibuprofen, Motrin, Advil, Goody's, BC's, all herbal medications, fish oil, and all vitamins.          WHAT DO I DO ABOUT MY DIABETES MEDICATION?   Do not take metFORMIN (GLUCOPHAGE-XR) the morning of surgery.  If you take glipiZIDE (GLUCOTROL XL) in the morning, do NOT take the morning of surgery  If you take glipiZIDE (GLUCOTROL XL) in the evening, do NOT take the evening before surgery.   HOW TO MANAGE YOUR DIABETES BEFORE AND AFTER SURGERY  Why is it important to control my blood sugar before and after surgery? Improving blood sugar levels before and after surgery helps healing and can limit problems. A way of improving blood sugar control is eating a healthy diet by:  Eating less sugar and carbohydrates  Increasing activity/exercise  Talking with your doctor about reaching your blood sugar goals High blood sugars (greater than 180 mg/dL) can raise your risk of infections and slow your recovery, so you will need to focus on controlling your diabetes during the weeks before surgery. Make sure that the doctor who takes care of your diabetes knows about your planned surgery including the date and location.  How do I manage my blood sugar before surgery? Check your blood sugar at least 4 times a day, starting 2 days before surgery, to make sure that the level is not too high or  low.  Check your blood sugar the morning of your surgery when you wake up and every 2 hours until you get to the Short Stay unit.  If your blood sugar is less than 70 mg/dL, you will need to treat for low blood sugar: Do not take insulin. Treat a low blood sugar (less than 70 mg/dL) with  cup of clear juice (cranberry or apple), 4 glucose tablets, OR glucose gel. Recheck blood sugar in 15 minutes after treatment (to make sure it is greater than 70 mg/dL). If your blood sugar is not greater than 70 mg/dL on recheck, call [phone] for further instructions. Report your blood sugar to the short stay nurse when you get to Short Stay.  If you are admitted to the hospital after surgery: Your blood sugar will be checked by the staff and you will probably be given insulin after surgery (instead of oral diabetes medicines) to make sure you have good blood sugar levels. The goal for blood sugar control after surgery is 80-180 mg/dL.  Do NOT Smoke (Tobacco/Vaping) for 24 hours prior to your procedure.  If you use a CPAP at night, you may bring your mask/headgear for your overnight stay.   Contacts, glasses, piercing's, hearing aid's, dentures or partials may not be worn into surgery, please bring cases for these belongings.    For patients admitted to the hospital, discharge time will be determined by your treatment team.   Patients discharged the  day of surgery will not be allowed to drive home, and someone needs to stay with them for 24 hours.  SURGICAL WAITING ROOM VISITATION Patients having surgery or a procedure may have no more than 2 support people in the waiting area - these visitors may rotate.   Children under the age of 57 must have an adult with them who is not the patient. If the patient needs to stay at the hospital during part of their recovery, the visitor guidelines for inpatient rooms apply. Pre-op nurse will coordinate an appropriate time for 1 support person to accompany  patient in pre-op.  This support person may not rotate.   Please refer to the [facility] website for the visitor guidelines for Inpatients (after your surgery is over and you are in a regular room).    Special instructions:   - Preparing For Surgery  Before surgery, you can play an important role. Because skin is not sterile, your skin needs to be as free of germs as possible. You can reduce the number of germs on your skin by washing with CHG (chlorahexidine gluconate) Soap before surgery.  CHG is an antiseptic cleaner which kills germs and bonds with the skin to continue killing germs even after washing.    Oral Hygiene is also important to reduce your risk of infection.  Remember - BRUSH YOUR TEETH THE MORNING OF SURGERY WITH YOUR REGULAR TOOTHPASTE  Please do not use if you have an allergy to CHG or antibacterial soaps. If your skin becomes reddened/irritated stop using the CHG.  Do not shave (including legs and underarms) for at least 48 hours prior to first CHG shower. It is OK to shave your face.  Please follow these instructions carefully.   Shower the NIGHT BEFORE SURGERY and the MORNING OF SURGERY  If you chose to wash your hair, wash your hair first as usual with your normal shampoo.  After you shampoo, rinse your hair and body thoroughly to remove the shampoo.  Use CHG Soap as you would any other liquid soap. You can apply CHG directly to the skin and wash gently with a scrungie or a clean washcloth.   Apply the CHG Soap to your body ONLY FROM THE NECK DOWN.  Do not use on open wounds or open sores. Avoid contact with your eyes, ears, mouth and genitals (private parts). Wash Face and genitals (private parts)  with your normal soap.   Wash thoroughly, paying special attention to the area where your surgery will be performed.  Thoroughly rinse your body with warm water from the neck down.  DO NOT shower/wash with your normal soap after using and rinsing off the CHG  Soap.  Pat yourself dry with a CLEAN TOWEL.  Wear CLEAN PAJAMAS to bed the night before surgery  Place CLEAN SHEETS on your bed the night before your surgery  DO NOT SLEEP WITH PETS.   Day of Surgery: Take a shower with CHG soap. Do not wear jewelry or makeup Do not wear lotions, powders, perfumes/colognes, or deodorant. Do not shave 48 hours prior to surgery.  Men may shave face and neck. Do not bring valuables to the hospital.  [facility] is not responsible for any belongings or valuables. Do not wear nail polish, gel polish, artificial nails, or any other type of covering on natural nails (fingers and toes) If you have artificial nails or gel coating that need to be removed by a nail salon, please have this removed prior to  surgery. Artificial nails or gel coating may interfere with anesthesia's ability to adequately monitor your vital signs.  Wear Clean/Comfortable clothing the morning of surgery Remember to brush your teeth WITH YOUR REGULAR TOOTHPASTE.   Please read over the following fact sheets that you were given.    If you received a COVID test during your pre-op visit  it is requested that you wear a mask when out in public, stay away from anyone that may not be feeling well and notify your surgeon if you develop symptoms. If you have been in contact with anyone that has tested positive in the last 10 days please notify you surgeon.

## 2022-02-05 ENCOUNTER — Encounter (HOSPITAL_COMMUNITY)
Admission: RE | Admit: 2022-02-05 | Discharge: 2022-02-05 | Disposition: A | Discharge status: Home / Self Care | Payer: PPO | Source: Ambulatory Visit | Attending: Neurosurgery | Admitting: Neurosurgery

## 2022-02-05 ENCOUNTER — Encounter (HOSPITAL_COMMUNITY): Payer: Self-pay

## 2022-02-05 ENCOUNTER — Other Ambulatory Visit: Payer: Self-pay

## 2022-02-05 ENCOUNTER — Other Ambulatory Visit: Payer: Self-pay | Admitting: Neurosurgery

## 2022-02-05 VITALS — BP 160/86 | HR 66 | Temp 97.7°F | Resp 17 | Ht 68.0 in | Wt 213.0 lb

## 2022-02-05 DIAGNOSIS — Z01818 Encounter for other preprocedural examination: Secondary | ICD-10-CM | POA: Diagnosis not present

## 2022-02-05 LAB — CBC
HCT: 44.2 % (ref 39.0–52.0)
Hemoglobin: 14.9 g/dL (ref 13.0–17.0)
MCH: 29.4 pg (ref 26.0–34.0)
MCHC: 33.7 g/dL (ref 30.0–36.0)
MCV: 87.2 fL (ref 80.0–100.0)
Platelets: 301 10*3/uL (ref 150–400)
RBC: 5.07 MIL/uL (ref 4.22–5.81)
RDW: 12.6 % (ref 11.5–15.5)
WBC: 10 10*3/uL (ref 4.0–10.5)
nRBC: 0 % (ref 0.0–0.2)

## 2022-02-05 LAB — BASIC METABOLIC PANEL
Anion gap: 10 (ref 5–15)
BUN: 11 mg/dL (ref 8–23)
CO2: 25 mmol/L (ref 22–32)
Calcium: 9.3 mg/dL (ref 8.9–10.3)
Chloride: 106 mmol/L (ref 98–111)
Creatinine, Ser: 1.19 mg/dL (ref 0.61–1.24)
GFR, Estimated: >60 mL/min (ref >60)
Glucose, Bld: 174 mg/dL — ABNORMAL HIGH (ref 70–99)
Potassium: 4.1 mmol/L (ref 3.5–5.1)
Sodium: 141 mmol/L (ref 135–145)

## 2022-02-05 LAB — SURGICAL PCR SCREEN
MRSA, PCR: NEGATIVE
Staphylococcus aureus: POSITIVE — AB

## 2022-02-05 LAB — HEMOGLOBIN A1C
Hgb A1c MFr Bld: 7.5 % — ABNORMAL HIGH (ref 4.8–5.6)
Mean Plasma Glucose: 168.55 mg/dL

## 2022-02-05 LAB — GLUCOSE, CAPILLARY: Glucose-Capillary: 205 mg/dL — ABNORMAL HIGH (ref 70–99)

## 2022-02-05 NOTE — Progress Notes (Signed)
PCP - Bing Matter PA-C Cardiologist - denies  PPM/ICD - denies Device Orders -  Rep Notified -   Chest x-ray - 05/10/03 EKG - 02/05/22 Stress Test - none ECHO - none Cardiac Cath - none  Sleep Study - none CPAP -   Fasting Blood Sugar - pt states he doesn't check his blood sugar. He says he has a meter and I encouraged him to start checking his blood sugar now at least three times a day so he can make sure it is not running greater than 180 before surgery. Pt and his wife agreed that he would start checking his blood sugar immediately.  Checks Blood Sugar _____ times a day  Last dose of GLP1 agonist-  na GLP1 instructions:   Blood Thinner Instructions:na Aspirin Instructions:na  ERAS Protcol -no PRE-SURGERY Ensure or G2-   COVID TEST- na   Anesthesia review: no  Patient denies shortness of breath, fever, cough and chest pain at PAT appointment   All instructions explained to the patient, with a verbal understanding of the material. Patient agrees to go over the instructions while at home for a better understanding. Patient also instructed to wear a mask when out in public prior to surgery. The opportunity to ask questions was provided.

## 2022-02-06 ENCOUNTER — Ambulatory Visit: Payer: PPO

## 2022-02-06 DIAGNOSIS — M5459 Other low back pain: Secondary | ICD-10-CM | POA: Diagnosis not present

## 2022-02-06 NOTE — Therapy (Signed)
OUTPATIENT PHYSICAL THERAPY THORACOLUMBAR TREATMENT   Patient Name: Jimmy Oconnor MRN: [id number] DOB:Jul 15, 1945, 76 y.o., male Today's Date: 02/06/2022   PT End of Session - 02/06/22 1348     Visit Number 5    Number of Visits 8    Date for PT Re-Evaluation 03/28/22    PT Start Time 5956    PT Stop Time 3875   Patient reported feeling comfortable and requested to leave early.   PT Time Calculation (min) 26 min    Activity Tolerance Patient tolerated treatment well    Behavior During Therapy [facility] for tasks assessed/performed                Past Medical History:  Diagnosis Date   Arm fracture childhood   left   Cancer ([place]) 2005   prostate   Diabetes mellitus without complication ([place])    Hyperlipidemia    Hypertension    Sleep apnea    mild case per pt   Past Surgical History:  Procedure Laterality Date   BLEPHAROPLASTY Bilateral 03/06/2021   COLONOSCOPY  2007   EXCISION MASS LOWER EXTREMETIES Left 05/29/2016   Procedure: EXCISION LEFT THIGH MASS;  Surgeon: Erroll Luna, MD;  Location: [place];  Service: General;  Laterality: Left;   EYE SURGERY     HAND SURGERY  2000   POLYPECTOMY     PROSTATECTOMY  2005   TONSILLECTOMY     VASECTOMY     VASECTOMY REVERSAL     Patient Active Problem List   Diagnosis Date Noted   DIVERTICULOSIS-COLON 05/02/2010   DIARRHEA 05/02/2010    PCP: Aletha Halim., PA-C  REFERRING PROVIDER: Vallarie Mare, MD  REFERRING DIAG: Spinal stenosis, lumbar region with neurogenic claudication  Rationale for Evaluation and Treatment Rehabilitation  THERAPY DIAG:  Other low back pain  ONSET DATE: 10/29/21  SUBJECTIVE:                                                                                                                                                                                           SUBJECTIVE STATEMENT: Patient reports that he feels about the same since he started therapy.   PERTINENT HISTORY:   Left knee pain, history of cancer, HTN, and DM   PAIN:  Are you having pain? Yes: NPRS scale: <1/10 Pain location: low back and both hips Pain description: throbbing, numbness, shooting  Aggravating factors: getting up from his couch, standing (short periods), walking ("a couple hundred yards") Relieving factors: sitting on a hard chair,   PRECAUTIONS: None  WEIGHT BEARING RESTRICTIONS: No  FALLS:  Has patient fallen in last 6  months? No  LIVING ENVIRONMENT: Lives with: lives with their spouse Lives in: House/apartment Stairs: Yes: Internal: "to basement"  steps; can reach both and External: "a couple" steps; on left going up; with step to pattern Has following equipment at home: Single point cane  OCCUPATION: retired  PLOF: Independent  PATIENT GOALS: reduced pain, improved mobility, improved strength   OBJECTIVE:  PATIENT SURVEYS:  FOTO 59.16  SCREENING FOR RED FLAGS: Bowel or bladder incontinence: No Spinal tumors: No Cauda equina syndrome: No Compression fracture: No Abdominal aneurysm: No  COGNITION: Overall cognitive status: Within functional limits for tasks assessed     SENSATION: Patient reports that he has numbness into the calf (L>R)   No deficits to light touch noted  POSTURE: rounded shoulders, forward head, and flexed trunk   PALPATION: No reported tenderness to palpation  LUMBAR ROM:   AROM eval 02/06/22 D/c  Flexion 54 56  Extension 22 26  Right lateral flexion 75% limited; discomfort  25% limited; discomfort  Left lateral flexion 75% limited; discomfort 25% limited; discomfort   Right rotation 25% limited; aggravated low back and left calf pain (unsure if it was left or right rotation)  WFL; discomfort  Left rotation 25% limited; aggravated low back and left calf pain (unsure if it was left or right rotation)  WFL; discomfort   (Blank rows = not tested)  LOWER EXTREMITY ROM: WFL for activities assessed     LOWER EXTREMITY MMT:    MMT  Right eval Left eval  Hip flexion 4+/5 4+/  Hip extension    Hip abduction    Hip adduction    Hip internal rotation    Hip external rotation    Knee flexion 4+/5 4/5  Knee extension 4/5 4+/5  Ankle dorsiflexion 4/5 4/5  Ankle plantarflexion    Ankle inversion    Ankle eversion     (Blank rows = not tested)  LUMBAR SPECIAL TESTS:  Straight leg raise test: Negative  GAIT: Assistive device utilized: None Level of assistance: Complete Independence   TODAY'S TREATMENT:                                                                                                                              DATE:                                     11/9 EXERCISE LOG  Exercise Repetitions and Resistance Comments  Nustep L4 x 15 minutes                    Blank cell = exercise not performed today                                   11/7  EXERCISE LOG  Exercise Repetitions and Resistance Comments  Nustep  L4 x 15 minutes   Side stepping  Green t-band x 2 minutes   Standing hip extension  Green t-band x 2 minutes   Wood chops  Blue t-band x 1 minute each   Eccentric heel tap  6' step x 20 reps each   Standing heel/toe raises  3 minutes     Blank cell = exercise not performed today                                    11/2 EXERCISE LOG  Exercise Repetitions and Resistance Comments  Nustep  L5 x 15 minutes   Lateral step up and over  6" step x 2 minutes    Wood chops  Orange XTS x 2 minutes each   Cybex lumbar extension  80# x 2 minutes    Cybex knee flexion  40# x 3 minutes   Cybex knee extension  10# x 3 minutes    Rocker board  4 minutes     Blank cell = exercise not performed today              PATIENT EDUCATION:  Education details: benefits of exercise, HEP  Person educated: Patient Education method: Explanation Education comprehension: verbalized understanding  HOME EXERCISE PROGRAM: [id number]  ASSESSMENT:  CLINICAL IMPRESSION: Patient is being discharged at this time  as he is scheduled for a procedure on his lumbar spine on 02/11/22. He was able to demonstrate a slight improvement in lumbar AROM. However, he feels that his back is about the same since starting therapy. His HEP was reviewed and he reported feeling comfortable with these interventions. He reported feeling comfortable being discharged at this time.   PHYSICAL THERAPY DISCHARGE SUMMARY  Visits from Start of Care: 5  Current functional level related to goals / functional outcomes: Patient is being discharged at this time due to his upcoming lumbar procedure on 02/11/22. He was unable to meet his goals for skilled physical therapy.    Remaining deficits: Pain, lumbar mobility, and functional mobility (navigating stairs)   Education / Equipment: HEP    Patient agrees to discharge. Patient goals were partially met. Patient is being discharged due to  a upcoming lumbar procedure on 02/11/22..   OBJECTIVE IMPAIRMENTS: Abnormal gait, decreased activity tolerance, decreased mobility, difficulty walking, decreased ROM, decreased strength, hypomobility, postural dysfunction, and pain.   ACTIVITY LIMITATIONS: lifting, bending, stairs, transfers, and locomotion level  PARTICIPATION LIMITATIONS: community activity and yard work  PERSONAL FACTORS: 3+ comorbidities: history of cancer, HTN, and DM   are also affecting patient's functional outcome.   REHAB POTENTIAL: Good  CLINICAL DECISION MAKING: Evolving/moderate complexity  EVALUATION COMPLEXITY: Moderate   GOALS: Goals reviewed with patient? Yes LONG TERM GOALS: Target date: 02/19/2022  Patient will be independent with his HEP.  Baseline:  Goal status: MET  2.  Patient will be able to navigate at least 4 steps with a reciprocal pattern for improved household mobility.  Baseline:  Goal status: NOT MET  3.  Patient will be able to complete his daily activities without his familiar pain exceeding 1/10.  Baseline:  Goal status: NOT  MET  4.  Patient will be able to walk at least 15 minutes without being limited by his familiar low back pain.  Baseline: about 10 minutes  Goal status: NOT MET  PLAN:  PT FREQUENCY: 2x/week  PT DURATION: 4 weeks  PLANNED INTERVENTIONS:  Therapeutic exercises, Therapeutic activity, Neuromuscular re-education, Balance training, Patient/Family education, Self Care, Joint mobilization, Stair training, Electrical stimulation, Spinal mobilization, Cryotherapy, Moist heat, Manual therapy, and Re-evaluation.  PLAN FOR NEXT SESSION: nustep, lumbar and lower extremity strengthening, and modalities as needed    Darlin Coco, PT 02/06/2022, 2:22 PM

## 2022-02-11 ENCOUNTER — Ambulatory Visit (HOSPITAL_COMMUNITY): Payer: PPO

## 2022-02-11 ENCOUNTER — Ambulatory Visit (HOSPITAL_BASED_OUTPATIENT_CLINIC_OR_DEPARTMENT_OTHER): Payer: PPO | Admitting: *Deleted

## 2022-02-11 ENCOUNTER — Ambulatory Visit (HOSPITAL_COMMUNITY): Payer: PPO | Admitting: *Deleted

## 2022-02-11 ENCOUNTER — Ambulatory Visit (HOSPITAL_COMMUNITY)
Admission: RE | Admit: 2022-02-11 | Discharge: 2022-02-11 | Disposition: A | Discharge status: Home / Self Care | Payer: PPO | Attending: Neurosurgery | Admitting: Neurosurgery

## 2022-02-11 ENCOUNTER — Other Ambulatory Visit: Payer: Self-pay

## 2022-02-11 ENCOUNTER — Encounter (HOSPITAL_COMMUNITY)
Admission: RE | Disposition: A | Discharge status: Home / Self Care | Payer: Self-pay | Source: Home / Self Care | Attending: Neurosurgery

## 2022-02-11 ENCOUNTER — Encounter (HOSPITAL_COMMUNITY): Payer: Self-pay

## 2022-02-11 DIAGNOSIS — E119 Type 2 diabetes mellitus without complications: Secondary | ICD-10-CM | POA: Diagnosis not present

## 2022-02-11 DIAGNOSIS — M48061 Spinal stenosis, lumbar region without neurogenic claudication: Secondary | ICD-10-CM | POA: Insufficient documentation

## 2022-02-11 DIAGNOSIS — Z7984 Long term (current) use of oral hypoglycemic drugs: Secondary | ICD-10-CM | POA: Insufficient documentation

## 2022-02-11 DIAGNOSIS — I10 Essential (primary) hypertension: Secondary | ICD-10-CM | POA: Diagnosis not present

## 2022-02-11 DIAGNOSIS — M7138 Other bursal cyst, other site: Secondary | ICD-10-CM

## 2022-02-11 DIAGNOSIS — G4730 Sleep apnea, unspecified: Secondary | ICD-10-CM | POA: Insufficient documentation

## 2022-02-11 DIAGNOSIS — Z01818 Encounter for other preprocedural examination: Secondary | ICD-10-CM

## 2022-02-11 HISTORY — PX: LUMBAR LAMINECTOMY/DECOMPRESSION MICRODISCECTOMY: SHX5026

## 2022-02-11 LAB — GLUCOSE, CAPILLARY
Glucose-Capillary: 182 mg/dL — ABNORMAL HIGH (ref 70–99)
Glucose-Capillary: 155 mg/dL — ABNORMAL HIGH (ref 70–99)
Glucose-Capillary: 155 mg/dL — ABNORMAL HIGH (ref 70–99)

## 2022-02-11 SURGERY — LUMBAR LAMINECTOMY/DECOMPRESSION MICRODISCECTOMY 1 LEVEL
Anesthesia: General

## 2022-02-11 MED ORDER — BUPIVACAINE HCL (PF) 0.5 % IJ SOLN
INTRAMUSCULAR | Status: AC
  Filled 2022-02-11: qty 30

## 2022-02-11 MED ORDER — ONDANSETRON HCL 4 MG/2ML IJ SOLN
INTRAMUSCULAR | Status: DC | PRN
  Administered 2022-02-11: 4 mg via INTRAVENOUS

## 2022-02-11 MED ORDER — SUGAMMADEX SODIUM 200 MG/2ML IV SOLN
INTRAVENOUS | Status: DC | PRN
  Administered 2022-02-11: 373.6 mg via INTRAVENOUS

## 2022-02-11 MED ORDER — BUPIVACAINE HCL (PF) 0.5 % IJ SOLN
INTRAMUSCULAR | Status: DC | PRN
  Administered 2022-02-11: 2.5 mL

## 2022-02-11 MED ORDER — LIDOCAINE 2% (20 MG/ML) 5 ML SYRINGE
INTRAMUSCULAR | Status: DC | PRN
  Administered 2022-02-11: 60 mg via INTRAVENOUS

## 2022-02-11 MED ORDER — CHLORHEXIDINE GLUCONATE 0.12 % MT SOLN
OROMUCOSAL | Status: AC
  Administered 2022-02-11: 15 mL via OROMUCOSAL
  Filled 2022-02-11: qty 15

## 2022-02-11 MED ORDER — FENTANYL CITRATE (PF) 100 MCG/2ML IJ SOLN: 25.0000 ug | INTRAMUSCULAR | Status: DC | PRN

## 2022-02-11 MED ORDER — DOCUSATE SODIUM 100 MG PO CAPS: 100.0000 mg | ORAL_CAPSULE | Freq: Two times a day (BID) | ORAL | 2 refills | Status: DC

## 2022-02-11 MED ORDER — FENTANYL CITRATE (PF) 250 MCG/5ML IJ SOLN
INTRAMUSCULAR | Status: DC | PRN
  Administered 2022-02-11: 150 ug via INTRAVENOUS

## 2022-02-11 MED ORDER — PROPOFOL 500 MG/50ML IV EMUL
INTRAVENOUS | Status: DC | PRN
  Administered 2022-02-11: 75 ug/kg/min via INTRAVENOUS

## 2022-02-11 MED ORDER — AMISULPRIDE (ANTIEMETIC) 5 MG/2ML IV SOLN: 10.0000 mg | Freq: Once | INTRAVENOUS | Status: DC | PRN

## 2022-02-11 MED ORDER — OXYCODONE HCL 5 MG PO TABS
5.0000 mg | ORAL_TABLET | Freq: Once | ORAL | Status: AC
  Administered 2022-02-11: 5 mg via ORAL

## 2022-02-11 MED ORDER — CHLORHEXIDINE GLUCONATE CLOTH 2 % EX PADS: 6.0000 | MEDICATED_PAD | Freq: Once | CUTANEOUS | Status: DC

## 2022-02-11 MED ORDER — ROCURONIUM BROMIDE 10 MG/ML (PF) SYRINGE
PREFILLED_SYRINGE | INTRAVENOUS | Status: DC | PRN
  Administered 2022-02-11: 60 mg via INTRAVENOUS
  Administered 2022-02-11: 20 mg via INTRAVENOUS

## 2022-02-11 MED ORDER — LIDOCAINE-EPINEPHRINE 1 %-1:100000 IJ SOLN
INTRAMUSCULAR | Status: AC
  Filled 2022-02-11: qty 1

## 2022-02-11 MED ORDER — OXYCODONE HCL 5 MG PO TABS
ORAL_TABLET | ORAL | Status: AC
  Filled 2022-02-11: qty 1

## 2022-02-11 MED ORDER — LIDOCAINE-EPINEPHRINE 1 %-1:100000 IJ SOLN
INTRAMUSCULAR | Status: DC | PRN
  Administered 2022-02-11: 2.5 mL

## 2022-02-11 MED ORDER — CYCLOBENZAPRINE HCL 10 MG PO TABS: 10.0000 mg | ORAL_TABLET | Freq: Three times a day (TID) | ORAL | 2 refills | Status: DC | PRN

## 2022-02-11 MED ORDER — METHYLPREDNISOLONE ACETATE 80 MG/ML IJ SUSP
INTRAMUSCULAR | Status: DC | PRN
  Administered 2022-02-11: 80 mg

## 2022-02-11 MED ORDER — LACTATED RINGERS IV SOLN: INTRAVENOUS | Status: DC

## 2022-02-11 MED ORDER — ACETAMINOPHEN 500 MG PO TABS
1000.0000 mg | ORAL_TABLET | Freq: Once | ORAL | Status: AC
  Administered 2022-02-11: 1000 mg via ORAL
  Filled 2022-02-11: qty 2

## 2022-02-11 MED ORDER — ORAL CARE MOUTH RINSE: 15.0000 mL | Freq: Once | OROMUCOSAL | Status: AC

## 2022-02-11 MED ORDER — FENTANYL CITRATE (PF) 250 MCG/5ML IJ SOLN
INTRAMUSCULAR | Status: AC
  Filled 2022-02-11: qty 5

## 2022-02-11 MED ORDER — THROMBIN 5000 UNITS EX SOLR
CUTANEOUS | Status: AC
  Filled 2022-02-11: qty 5000

## 2022-02-11 MED ORDER — 0.9 % SODIUM CHLORIDE (POUR BTL) OPTIME
TOPICAL | Status: DC | PRN
  Administered 2022-02-11: 1000 mL

## 2022-02-11 MED ORDER — OXYCODONE-ACETAMINOPHEN 5-325 MG PO TABS: 1.0000 | ORAL_TABLET | Freq: Four times a day (QID) | ORAL | 0 refills | Status: DC | PRN

## 2022-02-11 MED ORDER — DEXAMETHASONE SODIUM PHOSPHATE 10 MG/ML IJ SOLN
INTRAMUSCULAR | Status: DC | PRN
  Administered 2022-02-11: 10 mg via INTRAVENOUS

## 2022-02-11 MED ORDER — METHYLPREDNISOLONE ACETATE 80 MG/ML IJ SUSP
INTRAMUSCULAR | Status: AC
  Filled 2022-02-11: qty 1

## 2022-02-11 MED ORDER — PROPOFOL 10 MG/ML IV BOLUS
INTRAVENOUS | Status: DC | PRN
  Administered 2022-02-11: 150 mg via INTRAVENOUS

## 2022-02-11 MED ORDER — CEFAZOLIN SODIUM-DEXTROSE 2-4 GM/100ML-% IV SOLN
2.0000 g | INTRAVENOUS | Status: AC
  Administered 2022-02-11: 2 g via INTRAVENOUS

## 2022-02-11 MED ORDER — CHLORHEXIDINE GLUCONATE 0.12 % MT SOLN: 15.0000 mL | Freq: Once | OROMUCOSAL | Status: AC

## 2022-02-11 MED ORDER — THROMBIN 5000 UNITS EX SOLR
OROMUCOSAL | Status: DC | PRN
  Administered 2022-02-11: 5 mL via TOPICAL

## 2022-02-11 MED ORDER — PHENYLEPHRINE HCL-NACL 20-0.9 MG/250ML-% IV SOLN
INTRAVENOUS | Status: DC | PRN
  Administered 2022-02-11: 50 ug/min via INTRAVENOUS

## 2022-02-11 MED ORDER — CEFAZOLIN SODIUM-DEXTROSE 2-4 GM/100ML-% IV SOLN
INTRAVENOUS | Status: AC
  Filled 2022-02-11: qty 100

## 2022-02-11 MED ORDER — INSULIN ASPART 100 UNIT/ML IJ SOLN: 0.0000 [IU] | INTRAMUSCULAR | Status: DC | PRN

## 2022-02-11 SURGICAL SUPPLY — 55 items
ADH SKN CLS APL DERMABOND .7 (GAUZE/BANDAGES/DRESSINGS) ×1
APL SKNCLS STERI-STRIP NONHPOA (GAUZE/BANDAGES/DRESSINGS) ×1
BAG COUNTER SPONGE SURGICOUNT (BAG) ×2 IMPLANT
BAG SPNG CNTER NS LX DISP (BAG) ×3
BAND INSRT 18 STRL LF DISP RB (MISCELLANEOUS) ×2
BAND RUBBER #18 3X1/16 STRL (MISCELLANEOUS) ×4 IMPLANT
BENZOIN TINCTURE PRP APPL 2/3 (GAUZE/BANDAGES/DRESSINGS) ×2 IMPLANT
BLADE CLIPPER SURG (BLADE) IMPLANT
BUR CARBIDE MATCH 3.0 (BURR) IMPLANT
BUR PRECISION FLUTE 5.0 (BURR) IMPLANT
CANISTER SUCT 3000ML PPV (MISCELLANEOUS) ×2 IMPLANT
DERMABOND ADVANCED .7 DNX12 (GAUZE/BANDAGES/DRESSINGS) IMPLANT
DRAPE LAPAROTOMY 100X72X124 (DRAPES) ×2 IMPLANT
DRAPE MICROSCOPE SLANT 54X150 (MISCELLANEOUS) ×2 IMPLANT
DRAPE SURG 17X23 STRL (DRAPES) ×2 IMPLANT
DRSG MEPILEX BORDER 4X4 (GAUZE/BANDAGES/DRESSINGS) IMPLANT
DRSG OPSITE 4X5.5 SM (GAUZE/BANDAGES/DRESSINGS) IMPLANT
DRSG OPSITE POSTOP 3X4 (GAUZE/BANDAGES/DRESSINGS) ×2 IMPLANT
DURAPREP 26ML APPLICATOR (WOUND CARE) ×2 IMPLANT
ELECT BLADE INSULATED 4IN (ELECTROSURGICAL) ×1
ELECT REM PT RETURN 9FT ADLT (ELECTROSURGICAL) ×1
ELECTRODE BLADE INSULATED 4IN (ELECTROSURGICAL) ×2 IMPLANT
ELECTRODE REM PT RTRN 9FT ADLT (ELECTROSURGICAL) ×2 IMPLANT
GAUZE 4X4 16PLY ~~LOC~~+RFID DBL (SPONGE) IMPLANT
GLOVE BIOGEL PI IND STRL 7.5 (GLOVE) ×4 IMPLANT
GLOVE ECLIPSE 7.5 STRL STRAW (GLOVE) ×2 IMPLANT
GLOVE EXAM NITRILE XL STR (GLOVE) IMPLANT
GLOVE SURG ENC MOIS LTX SZ8 (GLOVE) ×2 IMPLANT
GLOVE SURG UNDER POLY LF SZ8.5 (GLOVE) ×2 IMPLANT
GOWN STRL REUS W/ TWL LRG LVL3 (GOWN DISPOSABLE) ×4 IMPLANT
GOWN STRL REUS W/ TWL XL LVL3 (GOWN DISPOSABLE) ×2 IMPLANT
GOWN STRL REUS W/TWL 2XL LVL3 (GOWN DISPOSABLE) IMPLANT
GOWN STRL REUS W/TWL LRG LVL3 (GOWN DISPOSABLE) ×2
GOWN STRL REUS W/TWL XL LVL3 (GOWN DISPOSABLE) ×1
HEMOSTAT POWDER KIT SURGIFOAM (HEMOSTASIS) ×2 IMPLANT
KIT BASIN OR (CUSTOM PROCEDURE TRAY) ×2 IMPLANT
KIT TURNOVER KIT B (KITS) ×2 IMPLANT
NDL SPNL 18GX3.5 QUINCKE PK (NEEDLE) IMPLANT
NEEDLE HYPO 22GX1.5 SAFETY (NEEDLE) ×2 IMPLANT
NEEDLE SPNL 18GX3.5 QUINCKE PK (NEEDLE) IMPLANT
NS IRRIG 1000ML POUR BTL (IV SOLUTION) ×2 IMPLANT
PACK LAMINECTOMY NEURO (CUSTOM PROCEDURE TRAY) ×2 IMPLANT
PAD ARMBOARD 7.5X6 YLW CONV (MISCELLANEOUS) ×6 IMPLANT
SPIKE FLUID TRANSFER (MISCELLANEOUS) ×2 IMPLANT
SPONGE SURGIFOAM ABS GEL SZ50 (HEMOSTASIS) IMPLANT
SPONGE T-LAP 4X18 ~~LOC~~+RFID (SPONGE) IMPLANT
STRIP CLOSURE SKIN 1/2X4 (GAUZE/BANDAGES/DRESSINGS) ×2 IMPLANT
SUT MNCRL AB 4-0 PS2 18 (SUTURE) ×2 IMPLANT
SUT VIC AB 0 CT1 18XCR BRD8 (SUTURE) ×2 IMPLANT
SUT VIC AB 0 CT1 8-18 (SUTURE) ×2
SUT VIC AB 2-0 CP2 18 (SUTURE) ×2 IMPLANT
SYR 3ML LL SCALE MARK (SYRINGE) IMPLANT
TOWEL GREEN STERILE (TOWEL DISPOSABLE) ×2 IMPLANT
TOWEL GREEN STERILE FF (TOWEL DISPOSABLE) ×2 IMPLANT
WATER STERILE IRR 1000ML POUR (IV SOLUTION) ×2 IMPLANT

## 2022-02-11 NOTE — Op Note (Signed)
PREOP DIAGNOSIS: Lumbar spinal stenosis with synovial cyst  POSTOP DIAGNOSIS: Lumbar spinal stenosis with synovial cyst  PROCEDURE: 1. L4-5 laminectomy, bilateral medial facetectomy, resection of degenerative cyst 2. Intraoperative microscope for microdissection  SURGEON: Dr. Duffy Rhody, MD  ASSISTANT: none   ANESTHESIA: General Endotracheal  EBL: 25 ml  SPECIMENS: None  DRAINS: None  COMPLICATIONS: none  CONDITION: Stable to PCAU  HISTORY: Jimmy Oconnor is a 76 y.o. male who initially presented to the outpatient clinic with severe lumbar radiculopathy. MRI showed severe stenosis with degenerative cysts at L4-5.  No dynamic instability seen on x-ray. Treatment options were discussed and the patient elected to proceed with surgical decompression.  Risks, benefits, alternatives, and expected convalescence were discussed.  Informed consent was obtained.  PROCEDURE IN DETAIL: After informed consent was obtained and witnessed, the patient was brought to the operating room. After induction of general anesthesia, the patient was positioned on the operative table in the prone position with all pressure points meticulously padded. The skin of the low back was then prepped and draped in the usual sterile fashion.  Using x-ray, the correct levels were identified and marked out on the skin, and after timeout was conducted, the skin was infiltrated with local anesthetic. Skin incision was then made sharply and Bovie electrocautery was used to dissect the subcutaneous tissue until the lumbodorsal fascia was identified. The fascia was then incised using Bovie electrocautery and the lamina at the L4-5 levels was identified and dissection was carried out in the subperiosteal plane. Self-retaining retractor was then placed, and intraoperative x-ray was taken to confirm we were at the correct levels.  Microscope was introduced into the field to allow for intraoperative microdissection.  Using a  high-speed drill, laminectomies were completed at L4-5. Bilateral medial facetectomies were performed. The ligamentum flavum was then identified and removed and the lateral edge of the thecal sac was identified bilaterally. Using a combination of the high-speed drill and Rogers, good lateral decompression was completed. Large synovial cyst on the left side emanating from the joint was freed from the nerve root and carefully dissected from the thecal sac. As expected, there was a fair amount of adherence.  In the right subarticular and epidural space, there was inflammatory material that was dissected carefully from the thecal sac, but no discrete cyst.  Kerrison rongeurs were then used to complete foraminotomies at L4-5 bilaterally. The decompression was confirmed using a small ball tip dissector. Hemostasis was then secured using a combination of morcellized Gelfoam and thrombin and bipolar electrocautery. The wound was irrigated with copious amounts of antibiotic saline irrigation.  Exparel was injected into the paraspinous muscles and soft tissues.  Self-retaining retractor was then removed, and the wound is closed in layers using a combination of interrupted 0 Vicryl and 2-0 Vicryl stitches followed by 4-0 monocryl in subcuticular manner and Dermabond.  A sterile dressing was placed.   At the end of the case all sponge, needle, and instrument counts were correct. The patient was then transferred to the stretcher and taken to the postanesthesia care unit in stable hemodynamic condition.

## 2022-02-11 NOTE — H&P (Signed)
CC: back and leg pain  HPI:     Patient is a 76 y.o. male presents with severe back pain and left and right pain.  He was found to have large synovial cysts.    Patient Active Problem List   Diagnosis Date Noted   DIVERTICULOSIS-COLON 05/02/2010   DIARRHEA 05/02/2010   Past Medical History:  Diagnosis Date   Arm fracture childhood   left   Cancer ([place]) 2005   prostate   Diabetes mellitus without complication ([place])    Hyperlipidemia    Hypertension    Sleep apnea    mild case per pt    Past Surgical History:  Procedure Laterality Date   BLEPHAROPLASTY Bilateral 03/06/2021   COLONOSCOPY  2007   EXCISION MASS LOWER EXTREMETIES Left 05/29/2016   Procedure: EXCISION LEFT THIGH MASS;  Surgeon: Erroll Luna, MD;  Location: [facility];  Service: General;  Laterality: Left;   EYE SURGERY     HAND SURGERY  2000   POLYPECTOMY     PROSTATECTOMY  2005   TONSILLECTOMY     VASECTOMY     VASECTOMY REVERSAL      Medications Prior to Admission  Medication Sig Dispense Refill Last Dose   atorvastatin (LIPITOR) 40 MG tablet Take 40 mg by mouth every evening.   02/10/2022   b complex vitamins capsule Take 1 capsule by mouth daily.   Past Week   glipiZIDE (GLUCOTROL XL) 5 MG 24 hr tablet Take 5 mg by mouth daily.   02/10/2022   lisinopril (PRINIVIL,ZESTRIL) 5 MG tablet Take 5 mg by mouth every evening.    02/10/2022   metFORMIN (GLUCOPHAGE-XR) 500 MG 24 hr tablet Take 500 mg by mouth 2 (two) times daily with a meal.   02/10/2022   OVER THE COUNTER MEDICATION Take 2 tablets by mouth daily. Z-Stack   Past Week   OVER THE COUNTER MEDICATION Take 1 capsule by mouth daily. CinnaChroma   Past Week   Allergies  Allergen Reactions   Clindamycin Diarrhea and Other (See Comments)    c-diff     Social History   Tobacco Use   Smoking status: Never    Passive exposure: Past   Smokeless tobacco: Never  Substance Use Topics   Alcohol use: No    Alcohol/week: 0.0 standard drinks of alcohol     Family History  Problem Relation Age of Onset   Colon cancer Neg Hx    Colon polyps Neg Hx    Crohn's disease Neg Hx    Esophageal cancer Neg Hx    Rectal cancer Neg Hx    Stomach cancer Neg Hx      Review of Systems Pertinent items are noted in HPI.  Objective:   Patient Vitals for the past 8 hrs:  BP Temp Temp src Resp SpO2 Height Weight  02/11/22 0606 (!) 143/81 98.6 F (37 C) Oral 18 92 % '5\' 8"'$  (1.727 m) 93.4 kg   No intake/output data recorded. No intake/output data recorded.      General : Alert, cooperative, no distress, appears stated age   Head:  Normocephalic/atraumatic    Eyes: PERRL, conjunctiva/corneas clear, EOM's intact. Fundi could not be visualized Neck: Supple Chest:  Respirations unlabored Chest wall: no tenderness or deformity Heart: Regular rate and rhythm Abdomen: Soft, nontender and nondistended Extremities: warm and well-perfused Skin: normal turgor, color and texture Neurologic:  Alert, oriented x 3.  Eyes open spontaneously. PERRL, EOMI, VFC, no facial droop. V1-3 intact.  No  dysarthria, tongue protrusion symmetric.  CNII-XII intact. Normal strength, sensation and reflexes throughout.  + SLR bilaterally       Data ReviewCBC:  Lab Results  Component Value Date   WBC 10.0 02/05/2022   RBC 5.07 02/05/2022   BMP:  Lab Results  Component Value Date   GLUCOSE 174 (H) 02/05/2022   CO2 25 02/05/2022   BUN 11 02/05/2022   CREATININE 1.19 02/05/2022   CALCIUM 9.3 02/05/2022   Radiology review:  MRI lumbar spine reviewed.  See clinic note  Assessment:   Active Problems:   * No active hospital problems. *  Severe lumbar stenosis  Plan:   - decompression and synovial cyst removal today

## 2022-02-11 NOTE — Discharge Instructions (Addendum)
Can remove transparent dressing and shower 48 hours after surgery Walk as much as possible No heavy lifting >10 lbs No excessive bending/twisting at the waist  

## 2022-02-11 NOTE — Anesthesia Preprocedure Evaluation (Signed)
Anesthesia Evaluation  Patient identified by MRN, date of birth, ID band Patient awake    Reviewed: Allergy & Precautions, NPO status , Patient's Chart, lab work & pertinent test results  Airway Mallampati: II  TM Distance: >3 FB Neck ROM: Full    Dental  (+) Dental Advisory Given   Pulmonary sleep apnea    breath sounds clear to auscultation       Cardiovascular hypertension, Pt. on medications  Rhythm:Regular Rate:Normal     Neuro/Psych negative neurological ROS     GI/Hepatic negative GI ROS, Neg liver ROS,,,  Endo/Other  diabetes, Type 2, Oral Hypoglycemic Agents    Renal/GU negative Renal ROS     Musculoskeletal   Abdominal   Peds  Hematology negative hematology ROS (+)   Anesthesia Other Findings   Reproductive/Obstetrics                              Lab Results  Component Value Date   WBC 10.0 02/05/2022   HGB 14.9 02/05/2022   HCT 44.2 02/05/2022   MCV 87.2 02/05/2022   PLT 301 02/05/2022   Lab Results  Component Value Date   CREATININE 1.19 02/05/2022   BUN 11 02/05/2022   NA 141 02/05/2022   K 4.1 02/05/2022   CL 106 02/05/2022   CO2 25 02/05/2022    Anesthesia Physical Anesthesia Plan  ASA: 2  Anesthesia Plan: General   Post-op Pain Management: Tylenol PO (pre-op)*   Induction: Intravenous  PONV Risk Score and Plan: 2 and Dexamethasone and Ondansetron  Airway Management Planned: Oral ETT  Additional Equipment: None  Intra-op Plan:   Post-operative Plan: Extubation in OR  Informed Consent: I have reviewed the patients History and Physical, chart, labs and discussed the procedure including the risks, benefits and alternatives for the proposed anesthesia with the patient or authorized representative who has indicated his/her understanding and acceptance.     Dental advisory given  Plan Discussed with: CRNA  Anesthesia Plan Comments:           Anesthesia Quick Evaluation

## 2022-02-11 NOTE — Anesthesia Postprocedure Evaluation (Signed)
Anesthesia Post Note  Patient: Ramces Shomaker Ballengee  Procedure(s) Performed: LAMINECTOMY FOR FACET/SYNOVIAL CYST LUMBAR FOUR-FIVE     Patient location during evaluation: PACU Anesthesia Type: General Level of consciousness: awake and alert Pain management: pain level controlled Vital Signs Assessment: post-procedure vital signs reviewed and stable Respiratory status: spontaneous breathing, nonlabored ventilation, respiratory function stable and patient connected to nasal cannula oxygen Cardiovascular status: blood pressure returned to baseline and stable Postop Assessment: no apparent nausea or vomiting Anesthetic complications: no   No notable events documented.  Last Vitals:  Vitals:   02/11/22 1000 02/11/22 1015  BP: 118/69 127/81  Pulse: (!) 56 (!) 55  Resp: 12 16  Temp:  36.4 C  SpO2: 91% 94%    Last Pain:  Vitals:   02/11/22 1015  TempSrc:   PainSc: 0-No pain                 Tiajuana Amass

## 2022-02-11 NOTE — Anesthesia Procedure Notes (Signed)
Procedure Name: Intubation Date/Time: 02/11/2022 8:00 AM  Performed by: Minerva Ends, CRNAPre-anesthesia Checklist: Patient identified, Emergency Drugs available, Suction available and Patient being monitored Patient Re-evaluated:Patient Re-evaluated prior to induction Oxygen Delivery Method: Circle system utilized Preoxygenation: Pre-oxygenation with 100% oxygen Induction Type: IV induction Ventilation: Mask ventilation with difficulty and Oral airway inserted - appropriate to patient size Laryngoscope Size: Mac and 3 Grade View: Grade I Tube type: Oral Tube size: 7.0 mm Number of attempts: 1 Airway Equipment and Method: Stylet and Oral airway Placement Confirmation: ETT inserted through vocal cords under direct vision, positive ETCO2 and breath sounds checked- equal and bilateral Secured at: 23 cm Tube secured with: Tape Dental Injury: Teeth and Oropharynx as per pre-operative assessment

## 2022-02-11 NOTE — Transfer of Care (Signed)
Immediate Anesthesia Transfer of Care Note  Patient: Jimmy Oconnor  Procedure(s) Performed: LAMINECTOMY FOR FACET/SYNOVIAL CYST LUMBAR FOUR-FIVE  Patient Location: PACU  Anesthesia Type:General  Level of Consciousness: sedated  Airway & Oxygen Therapy: Patient Spontanous Breathing and Patient connected to face mask oxygen  Post-op Assessment: Report given to RN and Post -op Vital signs reviewed and stable  Post vital signs: Reviewed and stable  Last Vitals:  Vitals Value Taken Time  BP 130/71 02/11/22 0945  Temp 36.4 C 02/11/22 0945  Pulse 58 02/11/22 0951  Resp 15 02/11/22 0951  SpO2 89 % 02/11/22 0951  Vitals shown include unvalidated device data.  Last Pain:  Vitals:   02/11/22 0945  TempSrc:   PainSc: Asleep         Complications: No notable events documented.

## 2022-02-12 ENCOUNTER — Encounter (HOSPITAL_COMMUNITY): Payer: Self-pay | Admitting: Neurosurgery

## 2022-02-12 LAB — SURGICAL PATHOLOGY

## 2022-09-19 ENCOUNTER — Encounter: Payer: Self-pay | Admitting: Gastroenterology

## 2022-09-24 ENCOUNTER — Telehealth: Payer: Self-pay | Admitting: Gastroenterology

## 2022-09-24 NOTE — Telephone Encounter (Signed)
He can be seen in an urgent APP appt slot. Sheri please help facilitate.

## 2022-09-24 NOTE — Telephone Encounter (Signed)
Good Afternoon Dr. Russella Dar,  Lawson Fiscal from [facility] reached out to our office to follow up on a referral that was sent for patient to be seen for rectal bleeding and diarrhea. I advised her that we had called the patient and left a voicemail to call back to schedule. Lawson Fiscal wanted to see if we could go ahead and make the appointment. I advised her that the next available  with you is in October and our PA's a re scheduling in September. Will you please review patients referral and advise on scheduling patient?  Thank you.

## 2022-09-25 NOTE — Telephone Encounter (Signed)
Spoke with patients wife and made her aware of the appointment. Also called the office and was not able to get anyone on the phone both times I call, so I left a detailed message with the appointment date and time and left our phone number just in case.

## 2022-09-25 NOTE — Telephone Encounter (Signed)
I put the patient on for tomorrow with Gunnar Fusi at 10:00.  [place], please contact the office and patient and inform them of the appointment.

## 2022-09-26 ENCOUNTER — Encounter: Payer: Self-pay | Admitting: Nurse Practitioner

## 2022-09-26 ENCOUNTER — Ambulatory Visit: Payer: PPO | Admitting: Nurse Practitioner

## 2022-09-26 ENCOUNTER — Encounter: Payer: Self-pay | Admitting: Gastroenterology

## 2022-09-26 VITALS — BP 130/70 | HR 58 | Ht 68.0 in | Wt 215.0 lb

## 2022-09-26 DIAGNOSIS — K625 Hemorrhage of anus and rectum: Secondary | ICD-10-CM | POA: Diagnosis not present

## 2022-09-26 DIAGNOSIS — Z86010 Personal history of colonic polyps: Secondary | ICD-10-CM | POA: Diagnosis not present

## 2022-09-26 DIAGNOSIS — R194 Change in bowel habit: Secondary | ICD-10-CM

## 2022-09-26 MED ORDER — NA SULFATE-K SULFATE-MG SULF 17.5-3.13-1.6 GM/177ML PO SOLN: 1.0000 | Freq: Once | ORAL | 0 refills | Status: AC

## 2022-09-26 NOTE — Patient Instructions (Addendum)
You have been scheduled for a colonoscopy. Please follow written instructions given to you at your visit today.  Please pick up your prep supplies at the pharmacy within the next 1-3 days. If you use inhalers (even only as needed), please bring them with you on the day of your procedure.   _______________________________________________________  If your blood pressure at your visit was 140/90 or greater, please contact your primary care physician to follow up on this.  _______________________________________________________  If you are age 72 or older, your body mass index should be between 23-30. Your Body mass index is 32.69 kg/m. If this is out of the aforementioned range listed, please consider follow up with your Primary Care Provider.  If you are age 49 or younger, your body mass index should be between 19-25. Your Body mass index is 32.69 kg/m. If this is out of the aformentioned range listed, please consider follow up with your Primary Care Provider.   ________________________________________________________  The [place] providers would like to encourage you to use [facility] to communicate with providers for non-urgent requests or questions.  Due to long hold times on the telephone, sending your provider a message by [facility] may be a faster and more efficient way to get a response.  Please allow 48 business hours for a response.  Please remember that this is for non-urgent requests.  _______________________________________________________ It was a pleasure to see you today!  Thank you for trusting me with your gastrointestinal care!

## 2022-09-26 NOTE — Progress Notes (Signed)
Primary GI:  Claudette Head, MD (2017)  Assessment / Plan   77 y.o. yo male with a past medical history consisting of, but not necessarily limited to DM , colon polyps, diverticulosis. Patient is referred by PCP for rectal bleeding and diarrhea.    Chronic painless rectal bleeding with bowel movements , suspect hemorrhoidal bleeding exacerbated by diarrhea.   Diarrhea x 1 year.  Started with an increase in Metformin dose a year ago. Diarrhea probably medication related but other considerations include IBD, microscopic colitis, SIBO or EPI.  -Patient tells me his PCP is planning to stop the metformin and start him on Ozempic.  He will make the switch after colonoscopy.  -Consider random colon biopsies at time of surveillance colonoscopy early July.   History of colon polyps.   Several small sessile serrated polyps removed July 2017.  At that time a 5-year repeat colonoscopy was recommended (letter sent to patient ) but he decided not to pursue it.  -Schedule for a surveillance colonoscopy. The risks and benefits of colonoscopy with possible polypectomy / biopsies were discussed and the patient agrees to proceed.    History of Present Illness   Chief Complaint: Rectal bleeding. Diarrhea from metformin  Contacted by PCP to work patient in for evaluation of diarrhea with blood.  Hemoglobin on 09/16/2022 was normal at 14.7  Jimmy Oconnor explains that his diarrhea started about 1 year ago when his metformin dose was increased.  He has chronic intermittent painless rectal bleeding with bowel movements and this has gotten worse with all the diarrhea and wiping.  No associated abdominal pain.  No signficant rectal pain. No unintentional weight loss over the last year in fact his weight is up about 9 pounds .  PCP planning to stop Metformin soon and start Ozempic.    Previous Endoscopies / Labs /  Imaging  July 2017 screening colonoscopy - Five 5 to 7 mm polyps in the sigmoid colon, in the transverse  colon, in the ascending colon, in the cecum and at the ileocecal valve. - Internal hemorrhoids. - Diverticulosis in the sigmoid colon. Surgical [P], sigmoid, transverse, ascending, cecum and ileocecal valve, polyp (5) - SESSILE SERRATED POLYP/ADENOMA (X6 FRAGMENTS). - HYPERPLASTIC POLYP (X3 FRAGMENTS) - NO DYSPLASIA OR MALIGNANCY.      Latest Ref Rng & Units 02/05/2022    3:33 PM 05/27/2016   10:59 AM 04/22/2016   12:26 PM  CBC  WBC 4.0 - 10.5 K/uL 10.0  11.5  11.0   Hemoglobin 13.0 - 17.0 g/dL 81.1  91.4  78.2   Hematocrit 39.0 - 52.0 % 44.2  46.0  46.1   Platelets 150 - 400 K/uL 301  283  300     No results found for: "LIPASE"    Latest Ref Rng & Units 02/05/2022    3:33 PM 05/27/2016   10:59 AM 05/02/2010   11:35 AM  CMP  Glucose 70 - 99 mg/dL 956  213  086   BUN 8 - 23 mg/dL 11  13  11    Creatinine 0.61 - 1.24 mg/dL 5.78  4.69  1.1   Sodium 135 - 145 mmol/L 141  138  138   Potassium 3.5 - 5.1 mmol/L 4.1  5.1  4.3   Chloride 98 - 111 mmol/L 106  107  106   CO2 22 - 32 mmol/L 25  24  25    Calcium 8.9 - 10.3 mg/dL 9.3  9.3  8.8   Total Protein 6.0 - 8.3 g/dL  6.4   Total Bilirubin 0.3 - 1.2 mg/dL   0.5   Alkaline Phos 39 - 117 U/L   47   AST 0 - 37 U/L   20   ALT 0 - 53 U/L   26     Past Medical History:  Diagnosis Date   Arm fracture childhood   left   Cancer (HCC) 2005   prostate   Diabetes mellitus without complication (HCC)    Hyperlipidemia    Hypertension    Sleep apnea    mild case per pt   Past Surgical History:  Procedure Laterality Date   BLEPHAROPLASTY Bilateral 03/06/2021   COLONOSCOPY  2007   EXCISION MASS LOWER EXTREMETIES Left 05/29/2016   Procedure: EXCISION LEFT THIGH MASS;  Surgeon: Harriette Bouillon, MD;  Location: [facility] OR;  Service: General;  Laterality: Left;   EYE SURGERY     HAND SURGERY  2000   LUMBAR LAMINECTOMY/DECOMPRESSION MICRODISCECTOMY N/A 02/11/2022   Procedure: LAMINECTOMY FOR FACET/SYNOVIAL CYST LUMBAR FOUR-FIVE;  Surgeon: Bedelia Person, MD;  Location: [facility] OR;  Service: Neurosurgery;  Laterality: N/A;  3C   POLYPECTOMY     PROSTATECTOMY  2005   TONSILLECTOMY     VASECTOMY     VASECTOMY REVERSAL     Family History  Problem Relation Age of Onset   Colon cancer Neg Hx    Colon polyps Neg Hx    Crohn's disease Neg Hx    Esophageal cancer Neg Hx    Rectal cancer Neg Hx    Stomach cancer Neg Hx    Social History   Tobacco Use   Smoking status: Never    Passive exposure: Past   Smokeless tobacco: Never  Vaping Use   Vaping Use: Never used  Substance Use Topics   Alcohol use: No    Alcohol/week: 0.0 standard drinks of alcohol   Drug use: No   Current Outpatient Medications  Medication Sig Dispense Refill   atorvastatin (LIPITOR) 40 MG tablet Take 40 mg by mouth every evening.     b complex vitamins capsule Take 1 capsule by mouth daily.     cyclobenzaprine (FLEXERIL) 10 MG tablet Take 1 tablet (10 mg total) by mouth 3 (three) times daily as needed for muscle spasms. 50 tablet 2   docusate sodium (COLACE) 100 MG capsule Take 1 capsule (100 mg total) by mouth 2 (two) times daily. 60 capsule 2   glipiZIDE (GLUCOTROL XL) 5 MG 24 hr tablet Take 5 mg by mouth daily.     lisinopril (PRINIVIL,ZESTRIL) 5 MG tablet Take 5 mg by mouth every evening.      metFORMIN (GLUCOPHAGE-XR) 500 MG 24 hr tablet Take 500 mg by mouth 2 (two) times daily with a meal.     OVER THE COUNTER MEDICATION Take 2 tablets by mouth daily. Z-Stack     OVER THE COUNTER MEDICATION Take 1 capsule by mouth daily. CinnaChroma     oxyCODONE-acetaminophen (PERCOCET) 5-325 MG tablet Take 1-2 tablets by mouth every 6 (six) hours as needed for severe pain. 50 tablet 0   No current facility-administered medications for this visit.   Allergies  Allergen Reactions   Clindamycin Diarrhea and Other (See Comments)    c-diff      Review of Systems: Positive for allergy, sinus trouble, back pain.  All other systems reviewed and negative except  where noted in HPI.   Wt Readings from Last 3 Encounters:  02/11/22 206 lb (93.4 kg)  02/05/22 213  lb (96.6 kg)  07/11/21 210 lb (95.3 kg)    Physical Exam:  BP 130/70   Pulse (!) 58   Ht 5\' 8"  (1.727 m)   Wt 215 lb (97.5 kg)   SpO2 95%   BMI 32.69 kg/m  Constitutional:  Pleasant, generally well appearing male in no acute distress. Psychiatric:  Normal mood and affect. Behavior is normal. EENT: Pupils normal.  Conjunctivae are normal. No scleral icterus. Neck supple.  Cardiovascular: Normal rate, regular rhythm.  Pulmonary/chest: Effort normal and breath sounds normal. No wheezing, rales or rhonchi. Abdominal: Soft, nondistended, nontender. Bowel sounds active throughout. There are no masses palpable. No hepatomegaly. Neurological: Alert and oriented to person place and time.  Willette Cluster, NP  09/26/2022, 7:37 AM  Cc:  Referring Provider Richmond Campbell., PA-C

## 2022-10-07 ENCOUNTER — Ambulatory Visit (AMBULATORY_SURGERY_CENTER): Payer: PPO | Admitting: Gastroenterology

## 2022-10-07 ENCOUNTER — Encounter: Payer: Self-pay | Admitting: Gastroenterology

## 2022-10-07 VITALS — BP 134/82 | HR 56 | Temp 98.7°F | Resp 21 | Ht 68.0 in | Wt 215.0 lb

## 2022-10-07 DIAGNOSIS — D123 Benign neoplasm of transverse colon: Secondary | ICD-10-CM

## 2022-10-07 DIAGNOSIS — R197 Diarrhea, unspecified: Secondary | ICD-10-CM | POA: Diagnosis not present

## 2022-10-07 DIAGNOSIS — K625 Hemorrhage of anus and rectum: Secondary | ICD-10-CM | POA: Diagnosis not present

## 2022-10-07 DIAGNOSIS — Z86010 Personal history of colonic polyps: Secondary | ICD-10-CM | POA: Diagnosis not present

## 2022-10-07 MED ORDER — SODIUM CHLORIDE 0.9 % IV SOLN: 500.0000 mL | Freq: Once | INTRAVENOUS | Status: DC

## 2022-10-07 NOTE — Progress Notes (Signed)
A and O x3. Report to RN. Tolerated MAC anesthesia well. 

## 2022-10-07 NOTE — Patient Instructions (Signed)
YOU HAD AN ENDOSCOPIC PROCEDURE TODAY AT THE [facility]:   Refer to the procedure report that was given to you for any specific questions about what was found during the examination.  If the procedure report does not answer your questions, please call your gastroenterologist to clarify.  If you requested that your care partner not be given the details of your procedure findings, then the procedure report has been included in a sealed envelope for you to review at your convenience later.  YOU SHOULD EXPECT: Some feelings of bloating in the abdomen. Passage of more gas than usual.  Walking can help get rid of the air that was put into your GI tract during the procedure and reduce the bloating. If you had a lower endoscopy (such as a colonoscopy or flexible sigmoidoscopy) you may notice spotting of blood in your stool or on the toilet paper. If you underwent a bowel prep for your procedure, you may not have a normal bowel movement for a few days.  Please Note:  You might notice some irritation and congestion in your nose or some drainage.  This is from the oxygen used during your procedure.  There is no need for concern and it should clear up in a day or so.  SYMPTOMS TO REPORT IMMEDIATELY:  Following lower endoscopy (colonoscopy or flexible sigmoidoscopy):  Excessive amounts of blood in the stool  Significant tenderness or worsening of abdominal pains  Swelling of the abdomen that is new, acute  Fever of 100F or higher   For urgent or emergent issues, a gastroenterologist can be reached at any hour by calling [phone]. Do not use MyChart messaging for urgent concerns.    DIET:  We do recommend a small meal at first, but then you may proceed to your regular diet.  Drink plenty of fluids but you should avoid alcoholic beverages for 24 hours. Follow a High Fiber Diet (see handout).  MEDICATIONS: Continue present medications.  Please see handouts given to you by your recovery  nurse: Polyps, Diverticulosis, Hemorrhoids, High Fiber Diet.  FOLLOW UP: Await pathology results. Repeat colonoscopy versus no repeat due to age after studies are complete for surveillance based on pathology results.  Thank you for allowing [place] to provide for your healthcare needs today.  ACTIVITY:  You should plan to take it easy for the rest of today and you should NOT DRIVE or use heavy machinery until tomorrow (because of the sedation medicines used during the test).    FOLLOW UP: Our staff will call the number listed on your records the next business day following your procedure.  We will call around 7:15- 8:00 am to check on you and address any questions or concerns that you may have regarding the information given to you following your procedure. If we do not reach you, we will leave a message.     If any biopsies were taken you will be contacted by phone or by letter within the next 1-3 weeks.  Please call us at [phone] if you have not heard about the biopsies in 3 weeks.    SIGNATURES/CONFIDENTIALITY: You and/or your care partner have signed paperwork which will be entered into your electronic medical record.  These signatures attest to the fact that that the information above on your After Visit Summary has been reviewed and is understood.  Full responsibility of the confidentiality of this discharge information lies with you and/or your care-partner.

## 2022-10-07 NOTE — Progress Notes (Signed)
See 09/26/2022 H&P, no changes

## 2022-10-07 NOTE — Op Note (Signed)
[facility] Patient Name: Jimmy Oconnor Procedure Date: 10/07/2022 11:28 AM MRN: [id number] Endoscopist: Meryl Dare , MD, [phone] Age: 77 Referring MD:  Date of Birth: 08-Mar-1946 Gender: Male Account #: [id number] Procedure:                Colonoscopy Indications:              Clinically significant diarrhea of unexplained                            origin, Rectal bleeding, Personal history of                            sessile serrated colon polyps Medicines:                Monitored Anesthesia Care Procedure:                Pre-Anesthesia Assessment:                           - Prior to the procedure, a History and Physical                            was performed, and patient medications and                            allergies were reviewed. The patient's tolerance of                            previous anesthesia was also reviewed. The risks                            and benefits of the procedure and the sedation                            options and risks were discussed with the patient.                            All questions were answered, and informed consent                            was obtained. Prior Anticoagulants: The patient has                            taken no anticoagulant or antiplatelet agents. ASA                            Grade Assessment: III - A patient with severe                            systemic disease. After reviewing the risks and                            benefits, the patient was deemed in satisfactory  condition to undergo the procedure.                           After obtaining informed consent, the colonoscope                            was passed under direct vision. Throughout the                            procedure, the patient's blood pressure, pulse, and                            oxygen saturations were monitored continuously. The                            CF [id number] #[id number] was  introduced through the anus                            and advanced to the the terminal ileum, with                            identification of the appendiceal orifice and IC                            valve. The terminal ileum, ileocecal valve,                            appendiceal orifice, and rectum were photographed.                            The quality of the bowel preparation was good. The                            colonoscopy was performed without difficulty. The                            patient tolerated the procedure well. Scope In: 11:33:17 AM Scope Out: 11:49:58 AM Scope Withdrawal Time: 0 hours 14 minutes 8 seconds  Total Procedure Duration: 0 hours 16 minutes 41 seconds  Findings:                 The perianal and digital rectal examinations were                            normal.                           The terminal ileum appeared normal.                           A 5 mm polyp was found in the transverse colon. The                            polyp was sessile. The polyp was removed with a  cold snare. Resection and retrieval were complete.                           Multiple medium-mouthed and small-mouthed                            diverticula were found in the left colon. There was                            no evidence of diverticular bleeding.                           Internal hemorrhoids were found during                            retroflexion. The hemorrhoids were small and Grade                            I (internal hemorrhoids that do not prolapse).                           The exam was otherwise without abnormality on                            direct and retroflexion views. Random biopsies                            obtained throughout. Complications:            No immediate complications. Estimated blood loss:                            None. Estimated Blood Loss:     Estimated blood loss: none. Impression:               -  The examined portion of the ileum was normal.                           - One 5 mm polyp in the transverse colon, removed                            with a cold snare. Resected and retrieved.                           - Moderate diverticulosis in the left colon.                           - Internal hemorrhoids.                           - The examination was otherwise normal on direct                            and retroflexion views. Biopsied. Recommendation:           - Repeat colonoscopy vs no repeat due to age after  studies are complete for surveillance based on                            pathology results.                           - Patient has a contact number available for                            emergencies. The signs and symptoms of potential                            delayed complications were discussed with the                            patient. Return to normal activities tomorrow.                            Written discharge instructions were provided to the                            patient.                           - High fiber diet.                           - Continue present medications.                           - Await pathology results. Meryl Dare, MD 10/07/2022 11:55:40 AM This report has been signed electronically.

## 2022-10-07 NOTE — Progress Notes (Signed)
Pt's states no medical or surgical changes since previsit or office visit. 

## 2022-10-08 ENCOUNTER — Telehealth: Payer: Self-pay

## 2022-10-08 NOTE — Telephone Encounter (Signed)
Unable to leave message- line busy.

## 2022-10-20 ENCOUNTER — Encounter: Payer: Self-pay | Admitting: Gastroenterology

## 2023-01-20 ENCOUNTER — Ambulatory Visit: Payer: PPO | Admitting: Gastroenterology
